# Patient Record
Sex: Female | Born: 1995 | Race: White | Hispanic: No | Smoking: Never smoker
Health system: Southern US, Community
[De-identification: ages and names within clinical notes are randomized; demographics above are authoritative.]

## PROBLEM LIST (undated history)

## (undated) ENCOUNTER — Emergency Department (HOSPITAL_BASED_OUTPATIENT_CLINIC_OR_DEPARTMENT_OTHER): Payer: Medicaid Other | Source: Home / Self Care

## (undated) DIAGNOSIS — Z87442 Personal history of urinary calculi: Secondary | ICD-10-CM

## (undated) DIAGNOSIS — F419 Anxiety disorder, unspecified: Secondary | ICD-10-CM

## (undated) DIAGNOSIS — I10 Essential (primary) hypertension: Secondary | ICD-10-CM

## (undated) DIAGNOSIS — R87629 Unspecified abnormal cytological findings in specimens from vagina: Secondary | ICD-10-CM

## (undated) DIAGNOSIS — F909 Attention-deficit hyperactivity disorder, unspecified type: Secondary | ICD-10-CM

## (undated) HISTORY — PX: CARPAL TUNNEL RELEASE: SHX101

## (undated) HISTORY — PX: TONSILLECTOMY: SUR1361

## (undated) HISTORY — PX: TUBAL LIGATION: SHX77

## (undated) HISTORY — PX: LEEP: SHX91

## (undated) HISTORY — DX: Unspecified abnormal cytological findings in specimens from vagina: R87.629

---

## 2016-01-21 ENCOUNTER — Encounter (HOSPITAL_BASED_OUTPATIENT_CLINIC_OR_DEPARTMENT_OTHER): Payer: Self-pay | Admitting: Emergency Medicine

## 2016-01-21 DIAGNOSIS — R109 Unspecified abdominal pain: Secondary | ICD-10-CM | POA: Insufficient documentation

## 2016-01-21 DIAGNOSIS — O26892 Other specified pregnancy related conditions, second trimester: Secondary | ICD-10-CM | POA: Diagnosis not present

## 2016-01-21 DIAGNOSIS — I10 Essential (primary) hypertension: Secondary | ICD-10-CM | POA: Insufficient documentation

## 2016-01-21 DIAGNOSIS — Z3A19 19 weeks gestation of pregnancy: Secondary | ICD-10-CM | POA: Insufficient documentation

## 2016-01-21 LAB — URINALYSIS, ROUTINE W REFLEX MICROSCOPIC
BILIRUBIN URINE: NEGATIVE
Glucose, UA: NEGATIVE mg/dL
Ketones, ur: NEGATIVE mg/dL
Nitrite: NEGATIVE
Protein, ur: NEGATIVE mg/dL
SPECIFIC GRAVITY, URINE: 1.015 (ref 1.005–1.030)
pH: 6 (ref 5.0–8.0)

## 2016-01-21 LAB — URINE MICROSCOPIC-ADD ON

## 2016-01-21 NOTE — ED Notes (Signed)
Patient states that the last 2 -3  Nights when she lays down to go to sleep has had pain to her left side. The patient states that she feels better during the day.

## 2016-01-22 ENCOUNTER — Emergency Department (HOSPITAL_BASED_OUTPATIENT_CLINIC_OR_DEPARTMENT_OTHER)
Admission: EM | Admit: 2016-01-22 | Discharge: 2016-01-22 | Disposition: A | Discharge status: Home / Self Care | Payer: Medicaid Other | Attending: Emergency Medicine | Admitting: Emergency Medicine

## 2016-01-22 ENCOUNTER — Ambulatory Visit (HOSPITAL_BASED_OUTPATIENT_CLINIC_OR_DEPARTMENT_OTHER)
Admit: 2016-01-22 | Discharge: 2016-01-22 | Disposition: A | Discharge status: Home / Self Care | Payer: Medicaid Other | Attending: Emergency Medicine | Admitting: Emergency Medicine

## 2016-01-22 DIAGNOSIS — R319 Hematuria, unspecified: Secondary | ICD-10-CM | POA: Insufficient documentation

## 2016-01-22 DIAGNOSIS — R109 Unspecified abdominal pain: Secondary | ICD-10-CM | POA: Insufficient documentation

## 2016-01-22 DIAGNOSIS — N1330 Unspecified hydronephrosis: Secondary | ICD-10-CM | POA: Diagnosis not present

## 2016-01-22 HISTORY — DX: Essential (primary) hypertension: I10

## 2016-01-22 MED ORDER — IBUPROFEN 800 MG PO TABS: 800.0000 mg | ORAL_TABLET | Freq: Three times a day (TID) | ORAL | Status: DC | PRN

## 2016-01-22 NOTE — ED Provider Notes (Signed)
CSN: [id number]     Arrival date & time 01/21/16  2300 History  By signing my name below, I, Linna DarnerRussell Turner, attest that this documentation has been prepared under the direction and in the presence of physician practitioner, Paula LibraJohn Dae Antonucci, MD. Electronically Signed: Linna Darnerussell Turner, Scribe. 01/22/2016. 12:55 AM.   Chief Complaint  Patient presents with  . Back Pain    The history is provided by the patient. No language interpreter was used.     HPI Comments: Cindy Martinez is a 20 y.o. female who presents to the Emergency Department complaining of intermittent, severe, left flank pain beginning three days ago. Pt reports that her pain typically occurs at night but also occurs during the day on occasion. She describes her pain as a burning sensation. She is not having pain at the present time. She states that she has difficulty finding a comfortable position when her pain presents and notes that the pain is not exacerbated by movement. She reports one episode of nausea in association with her lower back pain. Pt is [redacted] weeks pregnant. She denies hematuria and vomiting.  Past Medical History  Diagnosis Date  . Hypertension     with pregancy   History reviewed. No pertinent past surgical history. History reviewed. No pertinent family history. Social History  Substance Use Topics  . Smoking status: Never Smoker   . Smokeless tobacco: None  . Alcohol Use: No   OB History    Gravida Para Term Preterm AB TAB SAB Ectopic Multiple Living   1              Review of Systems  A complete 10 system review of systems was obtained and all systems are negative except as noted in the HPI and PMH.   Allergies  Review of patient's allergies indicates no known allergies.  Home Medications   Prior to Admission medications   Medication Sig Start Date End Date Taking? Authorizing Provider  ibuprofen (ADVIL,MOTRIN) 800 MG tablet Take 1 tablet (800 mg total) by mouth every 8 (eight) hours as needed (for  pain). 01/22/16   Opal Dinning, MD   BP 154/83 mmHg  Pulse 88  Temp(Src) 98 F (36.7 C) (Oral)  Resp 18  Ht 5\' 3"  (1.6 m)  Wt 170 lb (77.111 kg)  BMI 30.12 kg/m2  SpO2 100%   Physical Exam General: Well-developed, well-nourished female in no acute distress; appearance consistent with age of record HENT: normocephalic; atraumatic Eyes: pupils equal, round and reactive to light; extraocular muscles intact Neck: supple Heart: regular rate and rhythm Lungs: clear to auscultation bilaterally Abdomen: soft; gravid, consistent with dates; nontender; bowel sounds present GU: no CVA tenderness Extremities: No deformity; full range of motion; pulses normal Neurologic: Awake, alert and oriented; motor function intact in all extremities and symmetric; no facial droop Skin: Warm and dry Psychiatric: Normal mood and affect  ED Course  Procedures (including critical care time)  DIAGNOSTIC STUDIES: Oxygen Saturation is 100% on RA, normal by my interpretation.    COORDINATION OF CARE: 12:56 AM Discussed treatment plan with pt at bedside and pt agreed to plan.  MDM   Nursing notes and vitals signs, including pulse oximetry, reviewed.  Summary of this visit's results, reviewed by myself:  Labs:  Results for orders placed or performed during the hospital encounter of 01/22/16 (from the past 24 hour(s))  Urinalysis, Routine w reflex microscopic (not at [facility])     Status: Abnormal   Collection Time: 01/21/16 11:25 PM  Result  Value Ref Range   Color, Urine YELLOW YELLOW   APPearance CLEAR CLEAR   Specific Gravity, Urine 1.015 1.005 - 1.030   pH 6.0 5.0 - 8.0   Glucose, UA NEGATIVE NEGATIVE mg/dL   Hgb urine dipstick LARGE (A) NEGATIVE   Bilirubin Urine NEGATIVE NEGATIVE   Ketones, ur NEGATIVE NEGATIVE mg/dL   Protein, ur NEGATIVE NEGATIVE mg/dL   Nitrite NEGATIVE NEGATIVE   Leukocytes, UA TRACE (A) NEGATIVE  Urine microscopic-add on     Status: Abnormal   Collection Time: 01/21/16  11:25 PM  Result Value Ref Range   Squamous Epithelial / LPF 0-5 (A) NONE SEEN   WBC, UA 0-5 0 - 5 WBC/hpf   RBC / HPF 6-30 0 - 5 RBC/hpf   Bacteria, UA RARE (A) NONE SEEN   History and urinalysis consistent with ureterolithiasis. We will treat her with ibuprofen as she is in her second trimester and wish to avoid narcotics. Will have her return later today for a renal ultrasound.  Final diagnoses:  Acute left flank pain   I personally performed the services described in this documentation, which was scribed in my presence. The recorded information has been reviewed and is accurate.   Paula Libra, MD 01/22/16 [phone]

## 2016-09-03 HISTORY — PX: TUBAL LIGATION: SHX77

## 2016-10-27 IMAGING — US US RENAL
1 series · 14 of 25 positions shown · non-contrast
Comparison: None.

CLINICAL DATA: 20-year-old who is currently approximately 19 weeks
pregnant, presenting with 3 day history of left flank pain.
Microscopic hematuria on urinalysis.

EXAM:
RENAL / URINARY TRACT ULTRASOUND COMPLETE

[Series 1: us renal · 0.20mm/px · 14 of 58 slices shown]
[im 1/58]
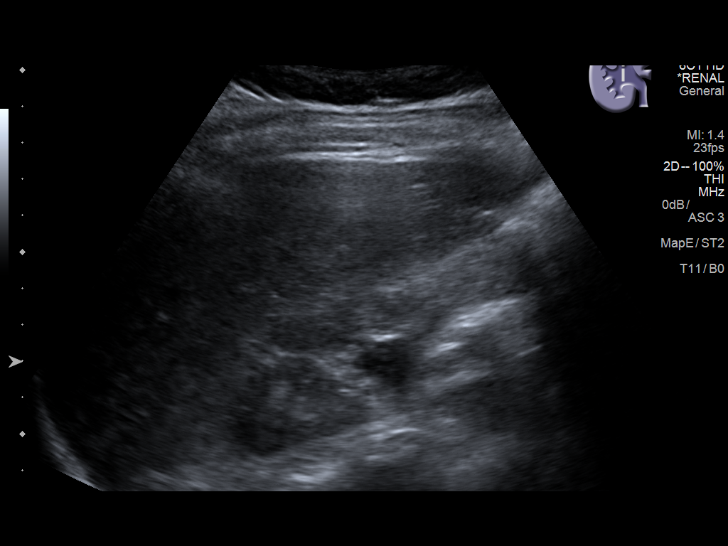
[im 5/58]
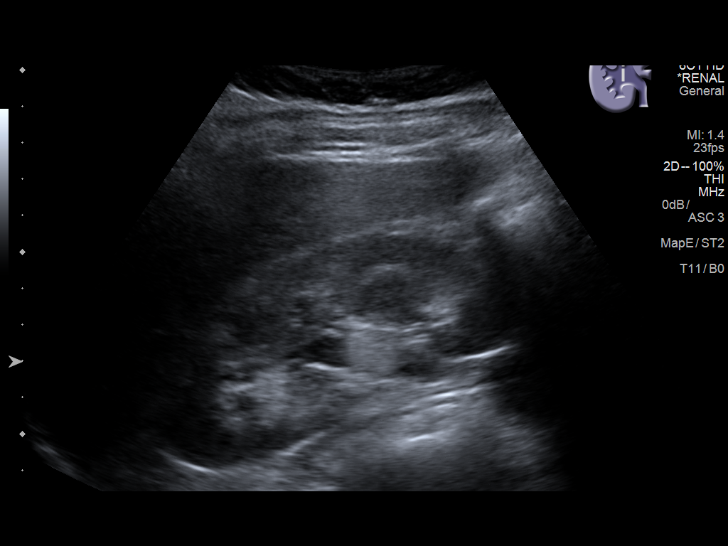
[im 10/58]
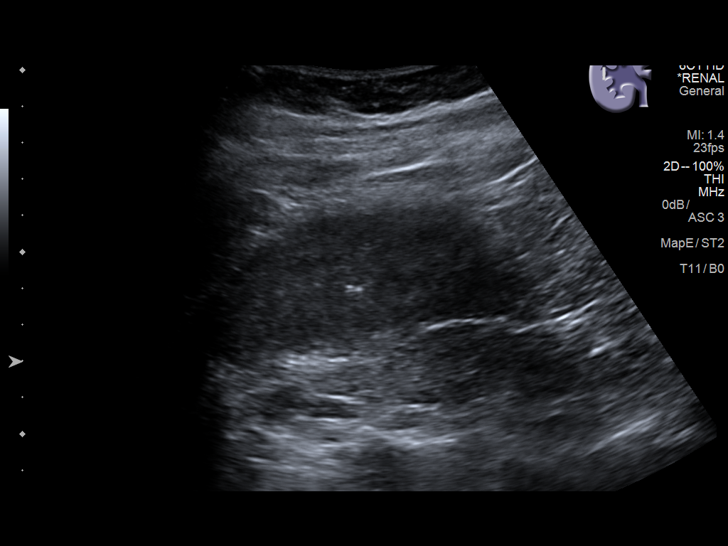
[im 15/58]
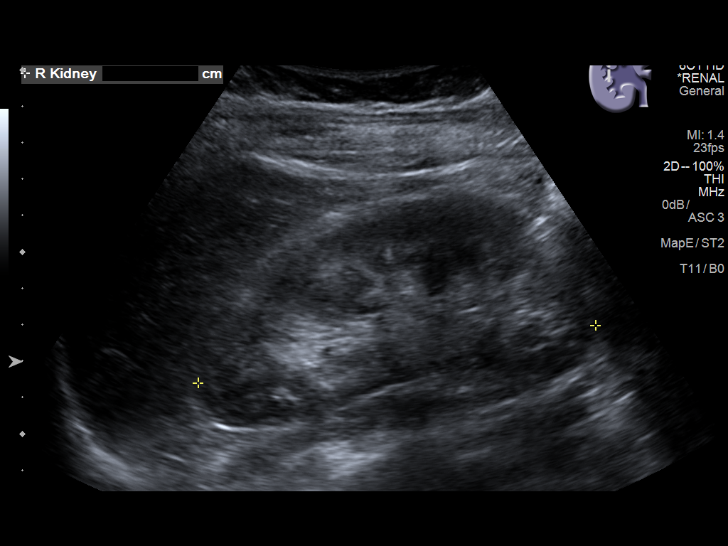
[im 20/58]
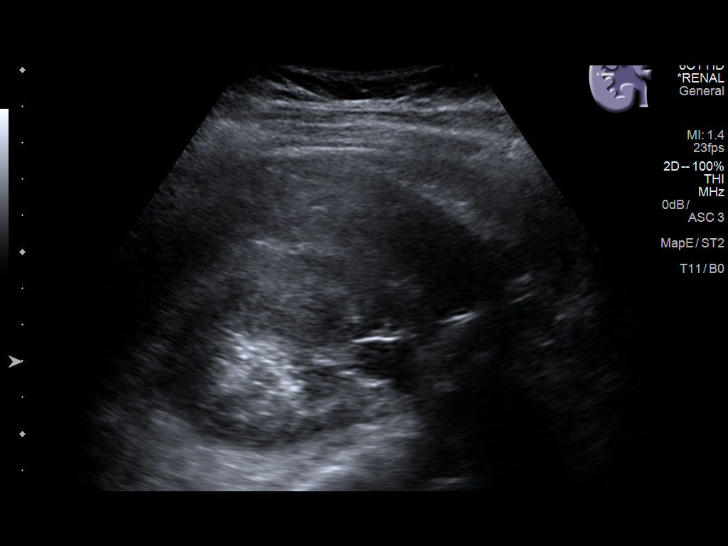
[im 22/58]
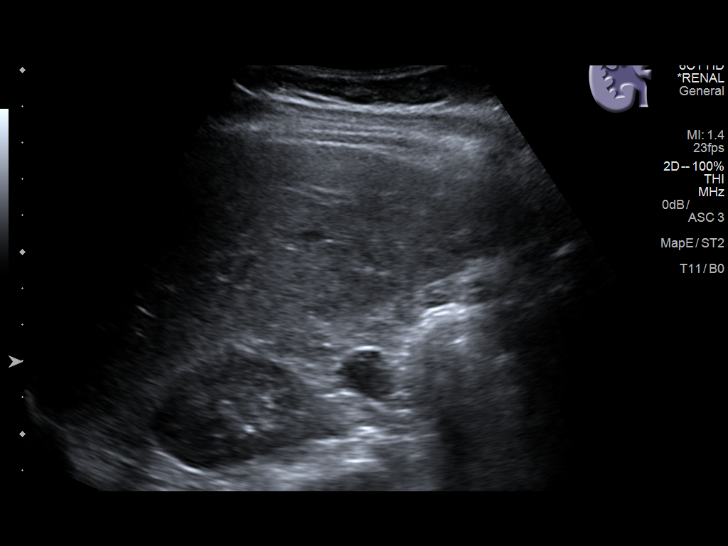
[im 27/58]
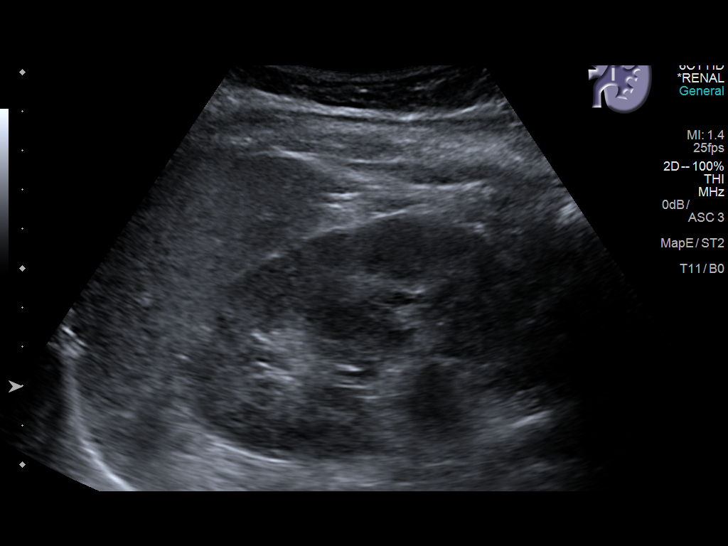
[im 31/58]
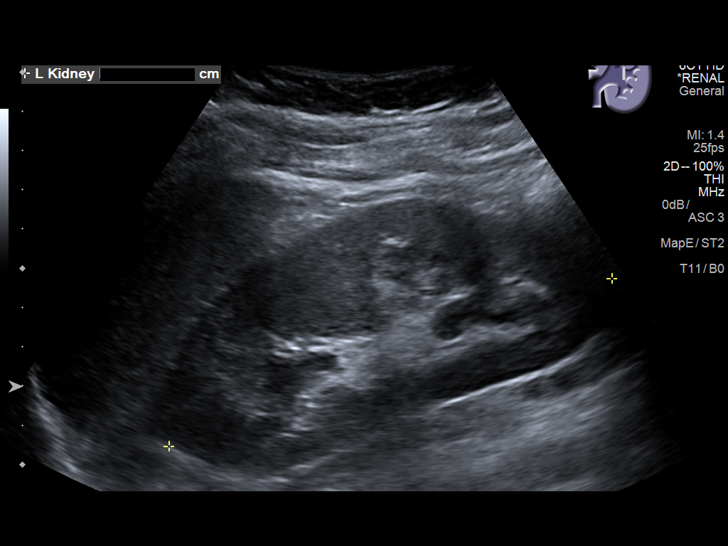
[im 36/58]
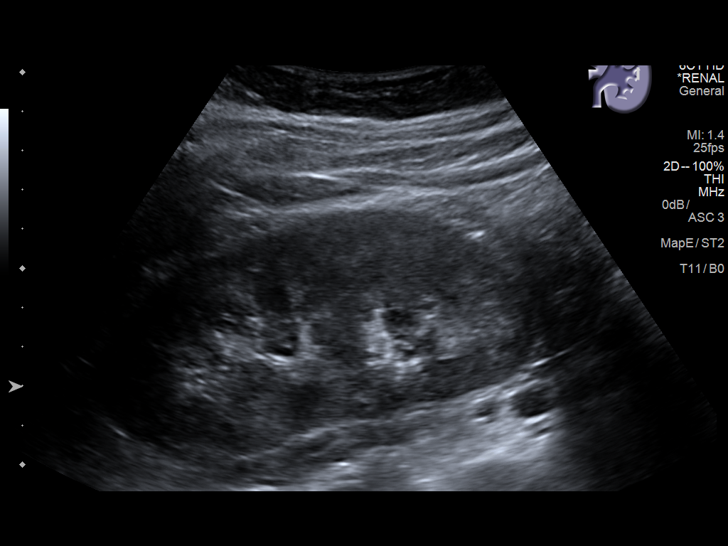
[im 39/58]
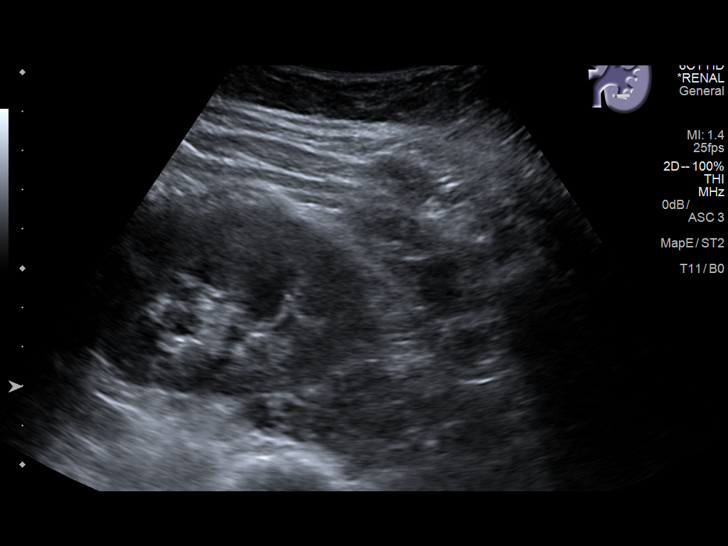
[im 43/58]
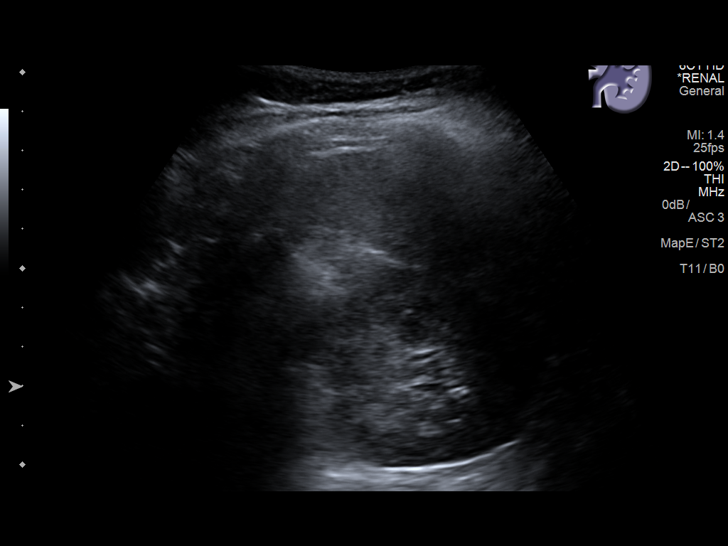
[im 48/58]
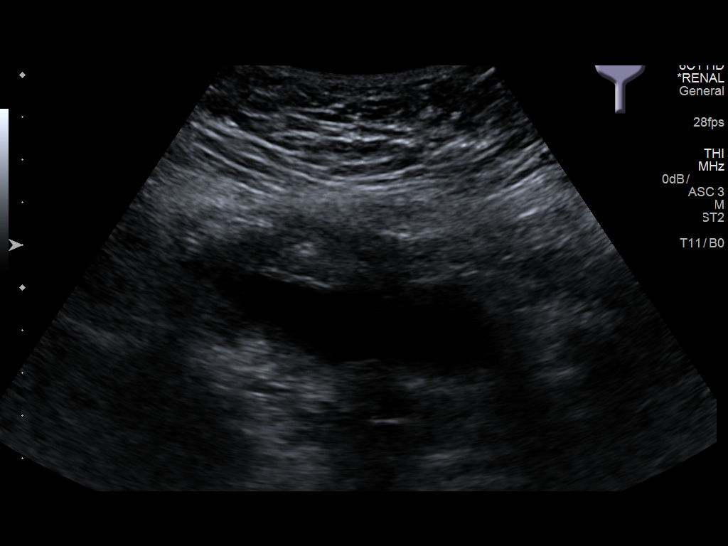
[im 53/58]
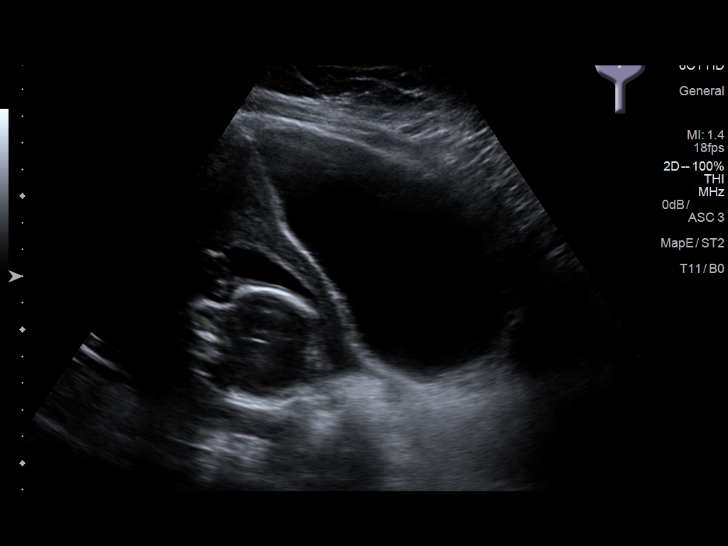
[im 58/58]
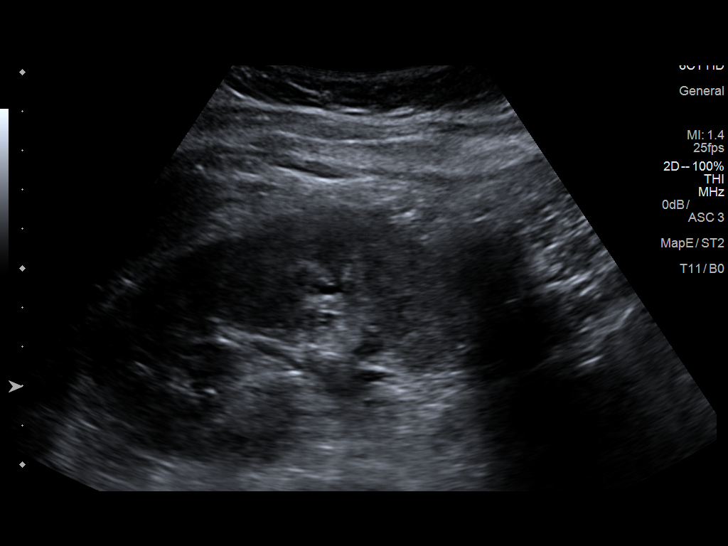

[14 of 25 positions shown; findings below may reference images not displayed]

FINDINGS: Right Kidney:

Length: Approximately 11.3 cm. No hydronephrosis. Well-preserved
cortex. No shadowing calculi. Normal parenchymal echotexture. No
focal parenchymal abnormality.

Left Kidney:

Length: Approximately 12.0 cm. Minimal to mild hydronephrosis.
Well-preserved cortex. No visible shadowing calculi. Normal
parenchymal echotexture. No focal parenchymal abnormality.

Bladder:

Normal in appearance. Patient is able to completely empty the
bladder upon voiding.

Other:

Cephalic presentation of the intrauterine fetus.
IMPRESSION: 1. Minimal to mild left hydronephrosis without visible obstructing
calculus. The hydronephrosis may be physiologic in this pregnant
patient.
2. Otherwise normal examination.

## 2018-09-03 HISTORY — PX: LEEP: SHX91

## 2018-09-03 HISTORY — PX: CARPAL TUNNEL RELEASE: SHX101

## 2019-07-25 ENCOUNTER — Other Ambulatory Visit: Payer: Self-pay

## 2019-07-25 ENCOUNTER — Emergency Department (HOSPITAL_BASED_OUTPATIENT_CLINIC_OR_DEPARTMENT_OTHER)
Admission: EM | Admit: 2019-07-25 | Discharge: 2019-07-25 | Disposition: A | Discharge status: Home / Self Care | Payer: Medicaid Other | Attending: Emergency Medicine | Admitting: Emergency Medicine

## 2019-07-25 ENCOUNTER — Emergency Department (HOSPITAL_BASED_OUTPATIENT_CLINIC_OR_DEPARTMENT_OTHER): Payer: Medicaid Other

## 2019-07-25 DIAGNOSIS — N200 Calculus of kidney: Secondary | ICD-10-CM | POA: Diagnosis not present

## 2019-07-25 DIAGNOSIS — N83201 Unspecified ovarian cyst, right side: Secondary | ICD-10-CM

## 2019-07-25 DIAGNOSIS — R1031 Right lower quadrant pain: Secondary | ICD-10-CM | POA: Diagnosis present

## 2019-07-25 DIAGNOSIS — R102 Pelvic and perineal pain: Secondary | ICD-10-CM

## 2019-07-25 LAB — COMPREHENSIVE METABOLIC PANEL
ALT: 24 U/L (ref 0–44)
AST: 23 U/L (ref 15–41)
Albumin: 4.4 g/dL (ref 3.5–5.0)
Alkaline Phosphatase: 61 U/L (ref 38–126)
Anion gap: 9 (ref 5–15)
BUN: 11 mg/dL (ref 6–20)
CO2: 21 mmol/L — ABNORMAL LOW (ref 22–32)
Calcium: 9.1 mg/dL (ref 8.9–10.3)
Chloride: 107 mmol/L (ref 98–111)
Creatinine, Ser: 0.7 mg/dL (ref 0.44–1.00)
GFR calc Af Amer: >60 mL/min (ref >60)
GFR calc non Af Amer: >60 mL/min (ref >60)
Glucose, Bld: 97 mg/dL (ref 70–99)
Potassium: 3.7 mmol/L (ref 3.5–5.1)
Sodium: 137 mmol/L (ref 135–145)
Total Bilirubin: 1.1 mg/dL (ref 0.3–1.2)
Total Protein: 7.5 g/dL (ref 6.5–8.1)

## 2019-07-25 LAB — WET PREP, GENITAL
Clue Cells Wet Prep HPF POC: NONE SEEN
Sperm: NONE SEEN
Trich, Wet Prep: NONE SEEN
Yeast Wet Prep HPF POC: NONE SEEN

## 2019-07-25 LAB — CBC WITH DIFFERENTIAL/PLATELET
Abs Immature Granulocytes: 0.03 10*3/uL (ref 0.00–0.07)
Basophils Absolute: 0.1 10*3/uL (ref 0.0–0.1)
Basophils Relative: 1 %
Eosinophils Absolute: 0.1 10*3/uL (ref 0.0–0.5)
Eosinophils Relative: 1 %
HCT: 44.4 % (ref 36.0–46.0)
Hemoglobin: 14.8 g/dL (ref 12.0–15.0)
Immature Granulocytes: 0 %
Lymphocytes Relative: 25 %
Lymphs Abs: 2.1 10*3/uL (ref 0.7–4.0)
MCH: 29 pg (ref 26.0–34.0)
MCHC: 33.3 g/dL (ref 30.0–36.0)
MCV: 87.1 fL (ref 80.0–100.0)
Monocytes Absolute: 0.5 10*3/uL (ref 0.1–1.0)
Monocytes Relative: 5 %
Neutro Abs: 5.9 10*3/uL (ref 1.7–7.7)
Neutrophils Relative %: 68 %
Platelets: 341 10*3/uL (ref 150–400)
RBC: 5.1 MIL/uL (ref 3.87–5.11)
RDW: 12.9 % (ref 11.5–15.5)
WBC: 8.7 10*3/uL (ref 4.0–10.5)
nRBC: 0 % (ref 0.0–0.2)

## 2019-07-25 LAB — URINALYSIS, ROUTINE W REFLEX MICROSCOPIC
Bilirubin Urine: NEGATIVE
Glucose, UA: NEGATIVE mg/dL
Hgb urine dipstick: NEGATIVE
Ketones, ur: NEGATIVE mg/dL
Nitrite: NEGATIVE
Protein, ur: NEGATIVE mg/dL
Specific Gravity, Urine: <1.005 — ABNORMAL LOW (ref 1.005–1.030)
pH: 6.5 (ref 5.0–8.0)

## 2019-07-25 LAB — PREGNANCY, URINE: Preg Test, Ur: NEGATIVE

## 2019-07-25 LAB — URINALYSIS, MICROSCOPIC (REFLEX): RBC / HPF: NONE SEEN RBC/hpf (ref 0–5)

## 2019-07-25 MED ORDER — SODIUM CHLORIDE 0.9 % IV BOLUS
1000.0000 mL | Freq: Once | INTRAVENOUS | Status: AC
  Administered 2019-07-25: 1000 mL via INTRAVENOUS

## 2019-07-25 MED ORDER — TRAMADOL HCL 50 MG PO TABS: 50.0000 mg | ORAL_TABLET | Freq: Four times a day (QID) | ORAL | 0 refills | Status: DC | PRN

## 2019-07-25 MED ORDER — MORPHINE SULFATE (PF) 4 MG/ML IV SOLN
4.0000 mg | Freq: Once | INTRAVENOUS | Status: AC
  Administered 2019-07-25: 4 mg via INTRAVENOUS
  Filled 2019-07-25: qty 1

## 2019-07-25 MED ORDER — KETOROLAC TROMETHAMINE 30 MG/ML IJ SOLN
30.0000 mg | Freq: Once | INTRAMUSCULAR | Status: AC
Start: 2019-07-25 — End: 2019-07-25
  Administered 2019-07-25: 30 mg via INTRAVENOUS
  Filled 2019-07-25: qty 1

## 2019-07-25 NOTE — ED Provider Notes (Signed)
[facility] EMERGENCY DEPARTMENT Provider Note   CSN: [id number] Arrival date & time: 07/25/19  1449     History   Chief Complaint Chief Complaint  Patient presents with   Flank Pain   Hematuria    HPI Cindy SharpsMariya Martinez is a 23 y.o. female.     HPI  Patient is 23 year old female presenting today for 4 weeks of right-sided pelvic pain that radiates into her back on the right side associated nausea, urinary urgency, and hematuria over the past 2 weeks.  Patient states that she has frequent kidney stones approximately 2 to 3/year that she has had since she was in her early teens.  Patient states she has not been seen urology or primary care doctor about this.  Patient states that she passed her last kidney stone 4 weeks ago which she states was visible in her urine.  Patient states that she has some bloating around the time but has since had intermittent, sharp, moderate to severe right lower quadrant and right flank pain.  Patient states there are no aggravating or mitigating factors.  States that she has been hydrating with lots of water.  States that her urine has been dark as if she was dehydrated.  States that at the end of urination she saw some amount of blood intermittently for the past 2 weeks.  States that she came to the ED today because of the pain got too bad.  Patient states she has taken no medications for pain today however she is used ibuprofen 800 mg 3 times a day in the past without any relief.  Patient states she is never had a CT scan of her abdomen.  She has had an US done 3 years ago and was found to have hydronephrosis.   Past Medical History:  Diagnosis Date   Hypertension    with pregancy    There are no active problems to display for this patient.   No past surgical history on file.   OB History    Gravida  1   Para      Term      Preterm      AB      Living        SAB      TAB      Ectopic      Multiple      Live Births              Home Medications    Prior to Admission medications   Medication Sig Start Date End Date Taking? Authorizing Provider  ibuprofen (ADVIL,MOTRIN) 800 MG tablet Take 1 tablet (800 mg total) by mouth every 8 (eight) hours as needed (for pain). 01/22/16   Molpus, John, MD    Family History No family history on file.  Social History Social History   Tobacco Use   Smoking status: Never Smoker  Substance Use Topics   Alcohol use: No   Drug use: No     Allergies   Patient has no known allergies.   Review of Systems Review of Systems  Constitutional: Negative for chills and fever.  HENT: Negative for congestion.   Respiratory: Negative for cough.   Cardiovascular: Negative for chest pain.  Gastrointestinal: Positive for abdominal pain and nausea. Negative for constipation, diarrhea, rectal pain and vomiting.  Genitourinary: Positive for flank pain, hematuria, pelvic pain and urgency. Negative for dysuria, vaginal bleeding, vaginal discharge and vaginal pain.  Musculoskeletal: Positive for back pain. Negative  for neck pain.  Skin: Negative for rash and wound.  All other systems reviewed and are negative.    Physical Exam Updated Vital Signs BP (!) 149/94 (BP Location: Right Arm)    Pulse 100    Temp 98.9 F (37.2 C) (Oral)    Resp 18    LMP 06/19/2019    SpO2 99%   Physical Exam Vitals signs and nursing note reviewed. Exam conducted with a chaperone present.  Constitutional:      General: She is not in acute distress.    Comments: Patient is well-appearing but uncomfortable  HENT:     Head: Normocephalic and atraumatic.     Nose: Nose normal.  Eyes:     General: No scleral icterus. Neck:     Musculoskeletal: Normal range of motion.  Cardiovascular:     Rate and Rhythm: Normal rate and regular rhythm.     Pulses: Normal pulses.     Heart sounds: Normal heart sounds.  Pulmonary:     Effort: Pulmonary effort is normal. No respiratory distress.      Breath sounds: No wheezing.  Abdominal:     Palpations: Abdomen is soft.     Tenderness: There is no abdominal tenderness. There is left CVA tenderness. There is no right CVA tenderness or guarding.     Comments: No abdominal tenderness, tenderness is more of pelvic location.  Negative Murphy sign, negative McBurney, negative Rovsing negative psoas sign.  Genitourinary:    General: Normal vulva.     Comments: Vulva without lesions or abnormality Vaginal canal without abnormal discharge or lesion Cervix appears normal, is closed Right-sided adnexal tenderness, no CMT or left sided adnexal tenderness Musculoskeletal:     Right lower leg: No edema.     Left lower leg: No edema.  Skin:    General: Skin is warm and dry.     Capillary Refill: Capillary refill takes less than 2 seconds.  Neurological:     Mental Status: She is alert. Mental status is at baseline.  Psychiatric:        Mood and Affect: Mood normal.        Behavior: Behavior normal.      ED Treatments / Results  Labs (all labs ordered are listed, but only abnormal results are displayed) Labs Reviewed  WET PREP, GENITAL - Abnormal; Notable for the following components:      Result Value   WBC, Wet Prep HPF POC MANY (*)    All other components within normal limits  URINALYSIS, ROUTINE W REFLEX MICROSCOPIC - Abnormal; Notable for the following components:   Color, Urine STRAW (*)    Specific Gravity, Urine <1.005 (*)    Leukocytes,Ua SMALL (*)    All other components within normal limits  COMPREHENSIVE METABOLIC PANEL - Abnormal; Notable for the following components:   CO2 21 (*)    All other components within normal limits  URINALYSIS, MICROSCOPIC (REFLEX) - Abnormal; Notable for the following components:   Bacteria, UA FEW (*)    All other components within normal limits  PREGNANCY, URINE  CBC WITH DIFFERENTIAL/PLATELET  GC/CHLAMYDIA PROBE AMP ([place]) NOT AT [facility]    EKG None  Radiology Ct Renal  Stone Study  Result Date: 07/25/2019 CLINICAL DATA:  Flank pain. Right-sided flank pain. EXAM: CT ABDOMEN AND PELVIS WITHOUT CONTRAST TECHNIQUE: Multidetector CT imaging of the abdomen and pelvis was performed following the standard protocol without IV contrast. COMPARISON:  None. FINDINGS: Lower chest: The lung bases are  clear. The heart size is normal. Hepatobiliary: The liver is normal. Normal gallbladder.There is no biliary ductal dilation. Pancreas: Normal contours without ductal dilatation. No peripancreatic fluid collection. Spleen: No splenic laceration or hematoma. Adrenals/Urinary Tract: --Adrenal glands: No adrenal hemorrhage. --Right kidney/ureter: There are multiple nonobstructing stones throughout the right kidney. There is mild right-sided collecting system dilatation without evidence for an obstructing stone. --Left kidney/ureter: There are nonobstructing stones in the lower pole the left kidney. --Urinary bladder: Unremarkable. Stomach/Bowel: --Stomach/Duodenum: No hiatal hernia or other gastric abnormality. Normal duodenal course and caliber. --Small bowel: No dilatation or inflammation. --Colon: No focal abnormality. --Appendix: Normal. Vascular/Lymphatic: Normal course and caliber of the major abdominal vessels. --No retroperitoneal lymphadenopathy. --No mesenteric lymphadenopathy. --No pelvic or inguinal lymphadenopathy. Reproductive: There is a right-sided ovarian cyst measuring approximately 7.6 by 5.4 cm and 21 Hounsfield units. The left ovary is unremarkable. Other: No ascites or free air. The abdominal wall is normal. Musculoskeletal. No acute displaced fractures. IMPRESSION: 1. Right-sided ovarian cyst measuring up to 7.6 cm. Recommend ultrasound follow-up in 6-8 weeks. If there is clinical concern for an acute right lower quadrant process, follow-up with a more urgent ultrasound is recommended. 2. Bilateral nonobstructive nephrolithiasis. 3. Mild right-sided collecting system  dilatation without evidence for an obstructing stone. Findings may represent sequela of a recently passed stone. Electronically Signed   By: Katherine Mantle M.D.   On: 07/25/2019 17:14    Procedures Procedures (including critical care time)  Medications Ordered in ED Medications  sodium chloride 0.9 % bolus 1,000 mL (0 mLs Intravenous Stopped 07/25/19 1740)  ketorolac (TORADOL) 30 MG/ML injection 30 mg (30 mg Intravenous Given 07/25/19 1643)  morphine 4 MG/ML injection 4 mg (4 mg Intravenous Given 07/25/19 1754)     Initial Impression / Assessment and Plan / ED Course  I have reviewed the triage vital signs and the nursing notes.  Pertinent labs & imaging results that were available during my care of the patient were reviewed by me and considered in my medical decision making (see chart for details).       Patient is 23 year old female with history of numerous kidney stones.  Presenting with right pelvic pain that radiates into her flank and low back.  Patient also states she has had some urgency and frequency.  Also endorses hematuria for the past 2 weeks.  States that she recently passed a kidney stone 4 weeks ago but has had persistent pain since.  States the pain is worse today.  No improvement ibuprofen.  Physical exam shows right-sided pelvic pain.  Patient also has left-sided CVA tenderness.  Suspect cyst, recurrent kidney stone, pyelonephritis, urinary tract infection, Fitz-Hugh Curtis.  Doubt appendicitis as patient does not have true abdominal tenderness, no anorexia, no migratory pain, no vomiting.  We will check CBC, CMP, GC chlamydia, wet prep, conduct pelvic exam, UA, urine pregnancy, lipase and order CT renal study.  CT renal scan shows bilateral nephrolithiasis nonobstructing, also there is right-sided ovarian cyst measuring 7.6 by 5.4 cm.  Pelvic exam conducted at this time significant for right-sided adnexal tenderness.  Will follow up with ultrasound to rule out  torsion.   Patient given 1 L of fluids, Toradol reassess.  No improvement in pain.  Patient states ibuprofen and NSAIDs do not work for her pain.  Patient does not demonstrate any evidence of drug-seeking behavior.  Will give 1 dose of morphine and reassess pain. Mild TTP right pelvic area stable - unchanging.   Lab work without  notable abnormality.  Ultrasound pending.    6:22 PM ultrasound pending.  If negative for torsion will discharge patient with instructions use ibuprofen for cyst pain and follow-up with primary care doctor or OB/GYN.  Patient also has received referral for urology and due to her frequent, chronic kidney stones. Patient care signed off to [facility].     Final Clinical Impressions(s) / ED Diagnoses   Final diagnoses:  Adnexal pain  Cyst of right ovary  Nephrolithiasis    ED Discharge Orders    None       Gailen Shelter, [place] 07/25/19 1831    Charlynne Pander, MD 07/26/19 [phone]

## 2019-07-25 NOTE — ED Triage Notes (Signed)
Pt here with hematuria and right flank pain.

## 2019-07-25 NOTE — Discharge Instructions (Addendum)
Please also urine with the filter that I gave you.  Save any kidney stones that he passed and follow-up with urology to help and was.  Please follow-up with your OB/GYN or primary care doctor for discussion and continued treatment of your right ovarian cyst.  The cyst procedure at a higher risk for torsion which would cause severe right lower quadrant pain.  Usually accompanied by nausea and decreased appetite.  Please return to the ED if you have any symptoms concerning for this or any new or concerning symptoms.  Please use Tylenol ibuprofen.  Please use Tylenol or ibuprofen for pain.  You may use 600 mg ibuprofen every 6 hours or 1000 mg of Tylenol every 6 hours.  You may choose to alternate between the 2.  This would be most effective.  Not to exceed 4 g of Tylenol within 24 hours.  Not to exceed 3200 mg ibuprofen 24 hours.

## 2019-07-25 NOTE — ED Notes (Signed)
Patient transported to Ultrasound 

## 2019-08-05 ENCOUNTER — Other Ambulatory Visit: Payer: Self-pay

## 2019-08-05 ENCOUNTER — Ambulatory Visit (INDEPENDENT_AMBULATORY_CARE_PROVIDER_SITE_OTHER): Payer: Medicaid Other | Admitting: Obstetrics & Gynecology

## 2019-08-05 ENCOUNTER — Encounter: Payer: Self-pay | Admitting: Obstetrics & Gynecology

## 2019-08-05 ENCOUNTER — Other Ambulatory Visit (HOSPITAL_COMMUNITY)
Admission: RE | Admit: 2019-08-05 | Discharge: 2019-08-05 | Disposition: A | Discharge status: Home / Self Care | Payer: Medicaid Other | Source: Ambulatory Visit | Attending: Obstetrics & Gynecology | Admitting: Obstetrics & Gynecology

## 2019-08-05 VITALS — BP 131/80 | HR 87 | Ht 63.0 in | Wt 183.1 lb

## 2019-08-05 DIAGNOSIS — Z0000 Encounter for general adult medical examination without abnormal findings: Secondary | ICD-10-CM | POA: Diagnosis not present

## 2019-08-05 DIAGNOSIS — Z01419 Encounter for gynecological examination (general) (routine) without abnormal findings: Secondary | ICD-10-CM | POA: Diagnosis present

## 2019-08-05 DIAGNOSIS — N83201 Unspecified ovarian cyst, right side: Secondary | ICD-10-CM

## 2019-08-05 DIAGNOSIS — N946 Dysmenorrhea, unspecified: Secondary | ICD-10-CM

## 2019-08-05 MED ORDER — DICLOFENAC POTASSIUM 50 MG PO TABS: 50.0000 mg | ORAL_TABLET | Freq: Three times a day (TID) | ORAL | 3 refills | Status: DC

## 2019-08-05 NOTE — Progress Notes (Signed)
Subjective:     Cindy Martinez is a 23 y.o. female here for a routine exam.G2P2. Pt reports that she was seen in the ED for right sided pain. Pt reports a h/o kidney stones but, this pain was differed than the prev pain that she has had. She reports that she noticed a bulge on her right side. Current complaints: pt reports cycles every 1-2 months.      Gynecologic History Patient's last menstrual period was 07/26/2019. Contraception: tubal ligation Last Pap: 2 years prev  Results were: normal Last mammogram: n/a  Obstetric History OB History  Gravida Para Term Preterm AB Living  2 2 2     1   SAB TAB Ectopic Multiple Live Births          1    # Outcome Date GA Lbr Len/2nd Weight Sex Delivery Anes PTL Lv  2 Term 2017 108w0d   F Vag-Spont None N LIV  1 Term 2016 [redacted]w[redacted]d   M Vag-Spont None N     The following portions of the patient's history were reviewed and updated as appropriate: allergies, current medications, past family history, past medical history, past social history, past surgical history and problem list.  Review of Systems Pertinent items are noted in HPI.    Objective:  BP 131/80   Pulse 87   Ht 5\' 3"  (1.6 m)   Wt 183 lb 1.9 oz (83.1 kg)   LMP 07/26/2019   Breastfeeding No   BMI 32.44 kg/m    07/25/2019 CLINICAL DATA:  Pain with large right ovarian cyst.  EXAM: TRANSABDOMINAL AND TRANSVAGINAL ULTRASOUND OF PELVIS  DOPPLER ULTRASOUND OF OVARIES  TECHNIQUE: Both transabdominal and transvaginal ultrasound examinations of the pelvis were performed. Transabdominal technique was performed for global imaging of the pelvis including uterus, ovaries, adnexal regions, and pelvic cul-de-sac.  It was necessary to proceed with endovaginal exam following the transabdominal exam to visualize the ovaries. Color and duplex Doppler ultrasound was utilized to evaluate blood flow to the ovaries.  COMPARISON:  CT from same day  FINDINGS: Uterus  Measurements: 9.4  x 5.1 x 6.4 cm = volume: 161 mL. No fibroids or other mass visualized.  Endometrium  Thickness: 7 mm period. A small amount of free fluid in the endometrial canal.  Right ovary  Measurements: 7.4 x 6.2 x 6.3 cm. = volume: 149 mL. There is a 6.3 x 5.8 x 6 cm cystic structure involving the right ovary. There is some low level internal echoes.  Left ovary  Measurements: 3.3 x 1.7 x 2 cm = volume: 6 mL. Normal appearance/no adnexal mass.  Pulsed Doppler evaluation of both ovaries demonstrates normal low-resistance arterial and venous waveforms.  Other findings  No abnormal free fluid.  IMPRESSION: 1. No acute sonographic abnormality. 2. Mostly simple appearing 6.3 cm cyst arising from the right ovary. A follow-up ultrasound is recommended in 6-8 weeks to confirm resolution or stability of this finding.  Assessment:    Healthy female exam.   Right ovarian cyst- this is simple in nature. Currently asymptomatic  Dysmenorrhea- primary     Plan:    Follow up in: 3 months.    F/u TV US with appt following F/u PAP and cx Cataflam 50mg  po tid prn pain Pt told to hold all other NSAIDS  [name], M.D., [name]

## 2019-08-10 ENCOUNTER — Other Ambulatory Visit: Payer: Self-pay | Admitting: Obstetrics & Gynecology

## 2019-08-10 DIAGNOSIS — R102 Pelvic and perineal pain: Secondary | ICD-10-CM

## 2019-08-10 LAB — CYTOLOGY - PAP
Chlamydia: NEGATIVE
Comment: NEGATIVE
Comment: NORMAL
Diagnosis: HIGH — AB
Neisseria Gonorrhea: POSITIVE — AB

## 2019-08-10 MED ORDER — NAPROXEN 500 MG PO TABS: 500.0000 mg | ORAL_TABLET | Freq: Two times a day (BID) | ORAL | 2 refills | Status: DC | PRN

## 2019-08-11 ENCOUNTER — Ambulatory Visit (INDEPENDENT_AMBULATORY_CARE_PROVIDER_SITE_OTHER): Payer: Medicaid Other

## 2019-08-11 ENCOUNTER — Other Ambulatory Visit: Payer: Self-pay

## 2019-08-11 ENCOUNTER — Telehealth: Payer: Self-pay

## 2019-08-11 VITALS — BP 139/87 | HR 94 | Ht 63.0 in | Wt 182.0 lb

## 2019-08-11 DIAGNOSIS — A549 Gonococcal infection, unspecified: Secondary | ICD-10-CM

## 2019-08-11 MED ORDER — AZITHROMYCIN 500 MG PO TABS: 1000.0000 mg | ORAL_TABLET | Freq: Every day | ORAL | 0 refills | Status: AC

## 2019-08-11 MED ORDER — CEFTRIAXONE SODIUM 250 MG IJ SOLR
250.0000 mg | Freq: Once | INTRAMUSCULAR | Status: AC
  Administered 2019-08-11: 250 mg via INTRAMUSCULAR

## 2019-08-11 NOTE — Telephone Encounter (Signed)
Patient called back- verified by name and dob. Patient made aware that she has positive result for gonerrhea on her pap smear.Patient coming today for rocephin injection.  Patient also made aware of abnormal pap smear and need for colposcopy. Patient schedule for that appointment. Kathrene Alu RN

## 2019-08-11 NOTE — Progress Notes (Signed)
Reviewed chart, lab results and plan of care Agree with medication administration as per protocol  Seabron Spates, CNM

## 2019-08-11 NOTE — Telephone Encounter (Signed)
-----   Message from Lavonia Drafts, MD sent at 08/10/2019  4:13 PM EST ----- Please call pt. She needs a colpo.  Thx,  clh-S

## 2019-08-11 NOTE — Telephone Encounter (Signed)
Patient also need treatment for gonerrhea- will need nurse visit for rocephin.  Left message for patient to call back for results. Kathrene Alu RN

## 2019-08-11 NOTE — Progress Notes (Signed)
Cindy Martinez here for Rocephin  Injection.  Injection administered without complication. Patient will return in As needed  for next injection. Pt advised to abstain from sex for 10 days after she and partner are treated.   chiquita l wilson, CMA 08/11/2019  4:10 PM

## 2019-08-12 LAB — GC/CHLAMYDIA PROBE AMP (~~LOC~~) NOT AT ARMC
Chlamydia: NEGATIVE
Neisseria Gonorrhea: POSITIVE — AB

## 2019-08-26 ENCOUNTER — Telehealth: Payer: Self-pay

## 2019-08-26 NOTE — Telephone Encounter (Signed)
Pt sent Mychart message stating she is having pain and Naproxen does not help her pain. Pt made aware that per Dr. Purvis Kilts, she can start OCPs or IUD for the pain. Pt states she does not want to take birth control because she has had a bad reaction to birth control in the past. Will send message to provider.  Dieter Hane l Beyounce Dickens, CMA

## 2019-08-29 ENCOUNTER — Other Ambulatory Visit: Payer: Self-pay

## 2019-08-29 ENCOUNTER — Emergency Department (HOSPITAL_BASED_OUTPATIENT_CLINIC_OR_DEPARTMENT_OTHER)
Admission: EM | Admit: 2019-08-29 | Discharge: 2019-08-29 | Disposition: A | Discharge status: Home / Self Care | Payer: Medicaid Other | Attending: Emergency Medicine | Admitting: Emergency Medicine

## 2019-08-29 ENCOUNTER — Encounter (HOSPITAL_BASED_OUTPATIENT_CLINIC_OR_DEPARTMENT_OTHER): Payer: Self-pay | Admitting: Emergency Medicine

## 2019-08-29 DIAGNOSIS — G5601 Carpal tunnel syndrome, right upper limb: Secondary | ICD-10-CM | POA: Insufficient documentation

## 2019-08-29 DIAGNOSIS — M25531 Pain in right wrist: Secondary | ICD-10-CM | POA: Diagnosis present

## 2019-08-29 NOTE — Discharge Instructions (Addendum)
Please take Tylenol and ibuprofen as discussed.  Please use the splint at night.  Please follow-up with Harlin Heys, MD who is a hand surgeon and orthopedist to evaluate you and discuss further treatment options.  I also included information for [place] which is a primary care service.  Please establish care with them.

## 2019-08-29 NOTE — ED Provider Notes (Signed)
[facility] EMERGENCY DEPARTMENT Provider Note   CSN: [id number] Arrival date & time: 08/29/19  0350     History Chief Complaint  Patient presents with  . Numbness    Cindy Martinez is a 23 y.o. female.  HPI  Patient is a 23 year old female with no pertinent past medical history presented today for right wrist pain that began at age 63 has been intermittent since.  Patient states that pain has been constant for the past 1 month worse over the past 1 week.  Patient describes pain as burning, tingling with decreased sensation.  Patient works as a Armed forces technical officer and states that the pain is worse at work.  Also states pain is worse at night and sometimes prevents her from sleeping.  Patient has been diagnosed with carpal tunnel in the past and given a wrist splint and prescribed NSAIDs however she has not been using these recently as she states he does not work.  Patient states that her symptoms were worse during past pregnancies.  Denies any fevers, chills, dysarthria, dysphagia, other limb weakness, history of strokes, nausea or vomiting.  Patient states that her symptoms today are consistent with prior episodes of her hand pain and numbness.     History reviewed. No pertinent past medical history.  There are no problems to display for this patient.   History reviewed. No pertinent surgical history.   OB History    Gravida  2   Para  2   Term  2   Preterm      AB      Living  1     SAB      TAB      Ectopic      Multiple      Live Births  1           No family history on file.  Social History   Tobacco Use  . Smoking status: Never Smoker  . Smokeless tobacco: Never Used  Substance Use Topics  . Alcohol use: Yes  . Drug use: No    Home Medications Prior to Admission medications   Medication Sig Start Date End Date Taking? Authorizing Provider  ibuprofen (ADVIL,MOTRIN) 800 MG tablet Take 1 tablet (800 mg total) by mouth every 8 (eight)  hours as needed (for pain). Patient not taking: Reported on 08/11/2019 01/22/16   Molpus, Jenny Reichmann, MD  naproxen (NAPROSYN) 500 MG tablet Take 1 tablet (500 mg total) by mouth 2 (two) times daily as needed for moderate pain. As needed for pain. Please take with meals. Patient not taking: Reported on 08/11/2019 08/10/19   Lavonia Drafts, MD  traMADol (ULTRAM) 50 MG tablet Take 1 tablet (50 mg total) by mouth every 6 (six) hours as needed. 07/25/19   Recardo Evangelist, PA-C    Allergies    Patient has no known allergies.  Review of Systems   Review of Systems  Constitutional: Negative for fever.  HENT: Negative for congestion.   Respiratory: Negative for shortness of breath.   Cardiovascular: Negative for chest pain.  Gastrointestinal: Negative for abdominal distention.  Musculoskeletal:       Right hand pain  Neurological: Positive for numbness. Negative for dizziness, seizures, speech difficulty, light-headedness and headaches.    Physical Exam Updated Vital Signs BP (!) 155/97 (BP Location: Left Arm)   Pulse 99   Temp 99.1 F (37.3 C) (Oral)   Resp 18   Ht 5\' 3"  (1.6 m)   Wt 81.6 kg  LMP 07/26/2019   SpO2 100%   BMI 31.89 kg/m   Physical Exam Vitals and nursing note reviewed.  Constitutional:      General: She is not in acute distress.    Appearance: Normal appearance. She is not ill-appearing.  HENT:     Head: Normocephalic and atraumatic.  Eyes:     General: No scleral icterus.       Right eye: No discharge.        Left eye: No discharge.     Conjunctiva/sclera: Conjunctivae normal.  Pulmonary:     Effort: Pulmonary effort is normal.     Breath sounds: No stridor.  Musculoskeletal:     Comments: Full range of motion of right wrist strength 5/5 flexion extension.  No tenderness to palpation of shoulder, elbow, wrist, fingers.  Good cap refill less than 2 seconds and all right fingers.  Grip strength equal bilaterally 5/5.    Neurological:     Mental  Status: She is alert and oriented to person, place, and time. Mental status is at baseline.     Comments: Sensation intact but decreased in right and significantly in right middle finger there is decreased sensation light touch.  Positive Phalen sign.  Negative Tinel's.     ED Results / Procedures / Treatments   Labs (all labs ordered are listed, but only abnormal results are displayed) Labs Reviewed - No data to display  EKG None  Radiology No results found.  Procedures Procedures (including critical care time)  Medications Ordered in ED Medications - No data to display  ED Course  I have reviewed the triage vital signs and the nursing notes.  Pertinent labs & imaging results that were available during my care of the patient were reviewed by me and considered in my medical decision making (see chart for details).    MDM Rules/Calculators/A&P                      Patient is 23 year old female with no pertinent past medical history other than prior diagnosis of carpal tunnel presented today with 1 month of worsening symptoms of intermittent for 7 years.  Patient has not been using her night splint or ibuprofen as she states that these had not been effective in the past.  Doubt septic joint as patient has no warmth or tenderness of the joint.  Has full range of motion without pain.  Doubt stroke as patient has isolated wrist pain and intact strength has no other symptoms or major risk factors for stroke.  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  Will refer to Dr. Roney Mans who is on-call with [facility] as patient has had this issue for 7 years and may be candidate for surgery pending his evaluation.  Will recommend she use his splint which I will provide for her in ED today and recommend ibuprofen and Tylenol in the interim.  Patient understands follow-up instructions and  will call Monday for an appointment.  Patient with elevated blood pressure.  We discussed this.  She sees OB and does not have a primary care doctor.  Will recommend [facility] for follow up.    This patient appears reasonably screened and I doubt any other medical condition requiring further workup, evaluation, or treatment in the ED at this time prior to discharge.   Patient's vitals are WNL apart from vital sign  abnormalities discussed above, patient is in NAD, and able to ambulate in the ED at their baseline. Pain has been managed or a plan has been made for home management and has no complaints prior to discharge. Patient is comfortable with above plan and is stable for discharge at this time. All questions were answered prior to disposition. Results from the ER workup discussed with the patient face to face and all questions answered to the best of my ability. The patient is safe for discharge with strict return precautions. Patient appears safe for discharge with appropriate follow-up. Conveyed my impression with the patient and they voiced understanding and are agreeable to plan.   An After Visit Summary was printed and given to the patient.  Portions of this note were generated with Scientist, clinical (histocompatibility and immunogenetics)Dragon dictation software. Dictation errors may occur despite best attempts at proofreading.    Final Clinical Impression(s) / ED Diagnoses Final diagnoses:  Carpal tunnel syndrome of right wrist    Rx / DC Orders ED Discharge Orders    None       Gailen ShelterFondaw, Zohan Shiflet S, [place] 08/29/19 [id number]    Terrilee FilesButler, Michael C, MD 08/29/19 1731

## 2019-08-29 NOTE — ED Triage Notes (Signed)
R hand numbness and pain x 1 week. States it has been intermittent over the past year but worsening and more persistent this week.

## 2019-08-31 ENCOUNTER — Ambulatory Visit (HOSPITAL_BASED_OUTPATIENT_CLINIC_OR_DEPARTMENT_OTHER)
Admission: RE | Admit: 2019-08-31 | Discharge: 2019-08-31 | Disposition: A | Discharge status: Home / Self Care | Payer: Medicaid Other | Source: Ambulatory Visit | Attending: Obstetrics & Gynecology | Admitting: Obstetrics & Gynecology

## 2019-08-31 ENCOUNTER — Other Ambulatory Visit: Payer: Self-pay

## 2019-08-31 DIAGNOSIS — N83201 Unspecified ovarian cyst, right side: Secondary | ICD-10-CM

## 2019-08-31 DIAGNOSIS — N946 Dysmenorrhea, unspecified: Secondary | ICD-10-CM | POA: Insufficient documentation

## 2019-09-09 ENCOUNTER — Ambulatory Visit (INDEPENDENT_AMBULATORY_CARE_PROVIDER_SITE_OTHER): Payer: Medicaid Other | Admitting: Obstetrics & Gynecology

## 2019-09-09 ENCOUNTER — Other Ambulatory Visit (HOSPITAL_COMMUNITY)
Admission: RE | Admit: 2019-09-09 | Discharge: 2019-09-09 | Disposition: A | Discharge status: Home / Self Care | Payer: Medicaid Other | Source: Ambulatory Visit | Attending: Obstetrics & Gynecology | Admitting: Obstetrics & Gynecology

## 2019-09-09 ENCOUNTER — Other Ambulatory Visit: Payer: Self-pay

## 2019-09-09 ENCOUNTER — Encounter: Payer: Self-pay | Admitting: Obstetrics & Gynecology

## 2019-09-09 VITALS — BP 126/79 | HR 82 | Ht 63.0 in | Wt 183.0 lb

## 2019-09-09 DIAGNOSIS — Z113 Encounter for screening for infections with a predominantly sexual mode of transmission: Secondary | ICD-10-CM | POA: Insufficient documentation

## 2019-09-09 DIAGNOSIS — R87613 High grade squamous intraepithelial lesion on cytologic smear of cervix (HGSIL): Secondary | ICD-10-CM | POA: Insufficient documentation

## 2019-09-09 DIAGNOSIS — Z3202 Encounter for pregnancy test, result negative: Secondary | ICD-10-CM | POA: Diagnosis not present

## 2019-09-09 DIAGNOSIS — D069 Carcinoma in situ of cervix, unspecified: Secondary | ICD-10-CM

## 2019-09-09 LAB — POCT URINE PREGNANCY: Preg Test, Ur: NEGATIVE

## 2019-09-09 NOTE — Patient Instructions (Signed)
Colposcopy, Care After This sheet gives you information about how to care for yourself after your procedure. Your health care provider may also give you more specific instructions. If you have problems or questions, contact your health care provider. What can I expect after the procedure? If you had a colposcopy without a biopsy, you can expect to feel fine right away, but you may have some spotting for a few days. You can go back to your normal activities. If you had a colposcopy with a biopsy, it is common to have:  Soreness and pain. This may last for a few days.  Light-headedness.  Mild vaginal bleeding or dark-colored, grainy discharge. This may last for a few days. The discharge may be due to a solution that was used during the procedure. You may need to wear a sanitary pad during this time.  Spotting for at least 48 hours after the procedure. Follow these instructions at home:   Take over-the-counter and prescription medicines only as told by your health care provider. Talk with your health care provider about what type of over-the-counter pain medicine and prescription medicine you can start taking again. It is especially important to talk with your health care provider if you take blood-thinning medicine.  Do not drive or use heavy machinery while taking prescription pain medicine.  For at least 3 days after your procedure, or as long as told by your health care provider, avoid: ? Douching. ? Using tampons. ? Having sexual intercourse.  Continue to use birth control (contraception).  Limit your physical activity for the first day after the procedure as told by your health care provider. Ask your health care provider what activities are safe for you.  It is up to you to get the results of your procedure. Ask your health care provider, or the department performing the procedure, when your results will be ready.  Keep all follow-up visits as told by your health care provider.  This is important. Contact a health care provider if:  You develop a skin rash. Get help right away if:  You are bleeding heavily from your vagina or you are passing blood clots. This includes using more than one sanitary pad per hour for 2 hours in a row.  You have a fever or chills.  You have pelvic pain.  You have abnormal, yellow-colored, or bad-smelling vaginal discharge. This could be a sign of infection.  You have severe pain or cramps in your lower abdomen that do not get better with medicine.  You feel light-headed or dizzy, or you faint. Summary  If you had a colposcopy without a biopsy, you can expect to feel fine right away, but you may have some spotting for a few days. You can go back to your normal activities.  If you had a colposcopy with a biopsy, you may notice mild pain and spotting for 48 hours after the procedure.  Avoid douching, using tampons, and having sexual intercourse for 3 days after the procedure or as long as told by your health care provider.  Contact your health care provider if you have bleeding, severe pain, or signs of infection. This information is not intended to replace advice given to you by your health care provider. Make sure you discuss any questions you have with your health care provider. Document Revised: 08/02/2017 Document Reviewed: 04/06/2016 Elsevier Patient Education  2020 [employer] LEEP POST-PROCEDURE INSTRUCTIONS  1. You may take Ibuprofen, Aleve or Tylenol for pain if needed.  Cramping is  normal.  2. You will have black and/or bloody discharge at first.  This will lighten and then turn clear before completely resolving.  This will take 2 to 3 weeks.  3. Put nothing in your vagina until the bleeding or discharge stops (usually 2 or3 days).  4. You need to call if you have redness around the biopsy site, if there is any unusual draining, if the bleeding is heavy, or if you are concerned.  5. Shower or bathe as normal   6. We will call you within one week with results or we will discuss the results at your follow-up appointment if needed.  7. You will need to return for a follow-up Pap smear as directed by your physician.

## 2019-09-09 NOTE — Addendum Note (Signed)
Addended by: Mikey Bussing on: 09/09/2019 02:43 PM   Modules accepted: Orders

## 2019-09-09 NOTE — Progress Notes (Signed)
Patient given informed consent, signed copy in the chart, time out was performed.  Placed in lithotomy position. Cervix viewed with speculum and colposcope after application of acetic acid.  08/05/2019 PositiveAbnormal     Chlamydia Negative   Adequacy Satisfactory for evaluation; transformation zone component PRESENT.   Diagnosis - High grade squamous intraepithelial lesion (HSIL)Abnormal    Comment Normal Reference Ranger Chlamydia - Negative   Comment Normal Reference Range Neisseria Gonorrhea - Negative    Colposcopy adequate?  yes Acetowhite lesions? Yes with lesion extending into the endocervix Punctation? Possibly at 1 o'clock Mosaicism?  no Abnormal vasculature?  No  Biopsies? yes ECC? yes   Patient was given post procedure instructions.  She will return in 4 week for possible LEEP. The biopsies will be aailabl on Epic or called to pt.   Yaniah Thiemann L. Harraway-Smith, M.D., [facility]

## 2019-09-11 LAB — CERVICOVAGINAL ANCILLARY ONLY
Chlamydia: NEGATIVE
Comment: NEGATIVE
Comment: NORMAL
Neisseria Gonorrhea: NEGATIVE

## 2019-09-11 LAB — SURGICAL PATHOLOGY

## 2019-09-14 ENCOUNTER — Telehealth: Payer: Self-pay

## 2019-09-14 NOTE — Telephone Encounter (Signed)
Patient called about her biopsy results. Patient had colposcopy and it did show some high grade dysplasia. Patient is already scheduled for a LEEP procedure and I explained this in detail to her. Patient also ask how long it should take for her to recover from the colposcopy biopsies made her aware that Dr. Erin Fulling recommends nothing in the vagina for two weeks after procedures in order to let the sites heal.  Patient states understanding. Cindy Stammer RN

## 2019-09-23 DIAGNOSIS — M25531 Pain in right wrist: Secondary | ICD-10-CM | POA: Insufficient documentation

## 2019-10-07 ENCOUNTER — Other Ambulatory Visit: Payer: Self-pay

## 2019-10-07 ENCOUNTER — Other Ambulatory Visit (HOSPITAL_COMMUNITY)
Admission: RE | Admit: 2019-10-07 | Discharge: 2019-10-07 | Disposition: A | Discharge status: Home / Self Care | Payer: Medicaid Other | Source: Ambulatory Visit | Attending: Obstetrics & Gynecology | Admitting: Obstetrics & Gynecology

## 2019-10-07 ENCOUNTER — Encounter: Payer: Self-pay | Admitting: Obstetrics & Gynecology

## 2019-10-07 ENCOUNTER — Ambulatory Visit (INDEPENDENT_AMBULATORY_CARE_PROVIDER_SITE_OTHER): Payer: Medicaid Other | Admitting: Obstetrics & Gynecology

## 2019-10-07 VITALS — BP 131/82 | HR 98 | Ht 63.0 in | Wt 193.0 lb

## 2019-10-07 DIAGNOSIS — N871 Moderate cervical dysplasia: Secondary | ICD-10-CM

## 2019-10-07 DIAGNOSIS — Z01812 Encounter for preprocedural laboratory examination: Secondary | ICD-10-CM

## 2019-10-07 DIAGNOSIS — R87613 High grade squamous intraepithelial lesion on cytologic smear of cervix (HGSIL): Secondary | ICD-10-CM | POA: Diagnosis present

## 2019-10-07 NOTE — Patient Instructions (Signed)
Loop Electrosurgical Excision Procedure Loop electrosurgical excision procedure (LEEP) is the cutting and removal (excision) of tissue from the cervix. The cervix is the bottom part of the uterus that opens into the vagina. The tissue that is removed from the cervix is examined to see if there are precancerous cells or cancer cells present. LEEP may be done when:  You have abnormal bleeding from your cervix.  You have an abnormal Pap test result.  Your health care provider finds an abnormality on your cervix during a pelvic exam. LEEP typically only takes a few minutes and is often done in the health care provider's office. The procedure is safe for women who are trying to get pregnant. However, the procedure is usually not done during a menstrual period or during pregnancy. Tell a health care provider about:  Any allergies you have.  All medicines you are taking, including vitamins, herbs, eye drops, creams, and over-the-counter medicines.  Any blood disorders you have.  Any medical conditions you have, including current or past vaginal infections such as herpes or sexually-transmitted infections (STIs).  Whether you are pregnant or may be pregnant.  Whether or not you are having vaginal bleeding on the day of the procedure. What are the risks? Generally, this is a safe procedure. However, problems may occur, including:  Infection.  Bleeding.  Allergic reactions to medicines.  Changes or scarring in the cervix.  Increased risk of early (preterm) labor in future pregnancies. What happens before the procedure?  Ask your health care provider about: ? Changing or stopping your regular medicines. This is especially important if you are taking diabetes medicines or blood thinners. ? Taking medicines such as aspirin and ibuprofen. These medicines can thin your blood. Do not take these medicines unless your health care provider tells you to take them. ? Taking over-the-counter  medicines, vitamins, herbs, and supplements.  Your health care provider may recommend that you take pain medicine before the procedure.  Ask your health care provider if you should plan to have someone take you home after the procedure. What happens during the procedure?   An instrument called a speculum will be placed in your vagina. This will allow your health care provider to see your cervix.  You will be given a medicine to numb the area (local anesthetic). The medicine will be injected into your cervix and the surrounding area.  A solution will be applied to your cervix. This solution will help the health care provider find the abnormal cells that need to be removed.  A thin wire loop will be passed through your vagina. The wire will be used to burn (cauterize) the cervical tissue with an electrical current.  You may feel faint during the procedure. Tell your health care provider right away if you feel this way.  The abnormal cervical tissue will be removed.  Any open blood vessels will be cauterized to prevent bleeding.  A paste may be applied to the cauterized area of your cervix to help prevent bleeding.  The sample of cervical tissue will be examined under a microscope. The procedure may vary among health care providers and hospitals. What can I expect after the procedure? After the procedure, it is common to have:  Mild abdominal cramps that are similar to menstrual cramps. These may last for up to 1 week.  A small amount of pink-tinged or bloody vaginal discharge, including light to moderate bleeding, for 1-2 weeks.  A dark-colored discharge coming from your vagina. This is from   the paste that was used on the cervix to prevent bleeding. It is up to you to get the results of your procedure. Ask your health care provider, or the department that is doing the procedure, when your results will be ready. Follow these instructions at home:  Take over-the-counter and  prescription medicines only as told by your health care provider.  Return to your normal activities as told by your health care provider. Ask your health care provider what activities are safe for you.  Do not put anything in your vagina for 2 weeks after the procedure or until your health care provider says that it is okay. This includes tampons, creams, and douches.  Do not have sex until your health care provider approves.  Keep all follow-up visits as told by your health care provider. This is important. Contact a health care provider if you:  Have a fever or chills.  Feel unusually weak.  Have vaginal bleeding that is heavier or longer than a normal menstrual cycle. A sign of this can be soaking a pad with blood or bleeding with clots.  Develop a bad smelling vaginal discharge.  Have severe abdominal pain or cramping. Summary  Loop electrosurgical excision procedure (LEEP) is the removal of tissue from the cervix. The removed tissue will be checked for precancerous cells or cancer cells.  LEEP typically only takes a few minutes and is often done in the health care provider's office.  Do not put anything in your vagina for 2 weeks after the procedure or until your health care provider says that it is okay. This includes tampons, creams, and douches.  Keep all follow-up visits as told by your health care provider. Ask your health care provider, or the department that is doing the procedure, when your results will be ready. This information is not intended to replace advice given to you by your health care provider. Make sure you discuss any questions you have with your health care provider. Document Revised: 09/12/2018 Document Reviewed: 09/12/2018 Elsevier Patient Education  2020 [employer] Inc. LEEP POST-PROCEDURE INSTRUCTIONS  1. You may take Ibuprofen, Aleve or Tylenol for pain if needed.  Cramping is normal.  2. You will have black and/or bloody discharge at first.  This  will lighten and then turn clear before completely resolving.  This will take 2 to 3 weeks.  3. Put nothing in your vagina until the bleeding or discharge stops (usually 2 or3 days).  4. You need to call if you have redness around the biopsy site, if there is any unusual draining, if the bleeding is heavy, or if you are concerned.  5. Shower or bathe as normal  6. We will call you within one week with results or we will discuss the results at your follow-up appointment if needed.  7. You will need to return for a follow-up Pap smear as directed by your physician. 

## 2019-10-07 NOTE — Progress Notes (Signed)
Pap smear and colposcopy reviewed.   Pap 08/05/2019 Colpo Biopsy/ECC 09/09/2019 FINAL MICROSCOPIC DIAGNOSIS:   A. 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   B. ENDOCERVIX, CURETTAGE:  - Benign endocervical mucosa.   GROSS DESCRIPTION:  A. Received in formalin are tan, soft tissue fragments that are  submitted in toto. Number: Multiple fragments size: 0.1 to 0.5 cm  blocks: 1  B. Received in formalin is blood tinged mucus that is entirely  submitted in one block. Volume: 1.5 x 1 x 0.1 cm (1 B) (AK 09/10/2019)   Risks, benefits, alternatives, and limitations of procedure explained to patient, including pain, bleeding, infection, failure to remove abnormal tissue and failure to cure dysplasia, need for repeat procedures, damage to pelvic organs, cervical incompetence.  Role of HPV,cervical dysplasia and need for close followup was empasized. Informed written consent was obtained. All questions were answered. Time out performed.  Procedure: The patient was placed in lithotomy position and the bivalved coated speculum was placed in the patient's vagina. A grounding pad placed on the patient. Local anesthesia was administered via an intracervical block using 10cc of 2% Lidocaine with epinephrine. The suction was turned on and the Medium 1X Fisher Cone Biopsy Excisor on 50 Watts of cutting current was used to excise the area of decreased uptake and excise the entire transformation zone. Excellent hemostasis was achieved using roller ball coagulation set at 60 Watts coagulation current. Monsel's solution was then applied and excellent hemostasis was noted.  The speculum was removed from the vagina. Specimens were sent to pathology. The patient tolerated the procedure well. Post-operative instructions given to patient, including instruction to seek medical attention for persistent bright red bleeding, fever, abdominal/pelvic  pain, dysuria, nausea or vomiting. She was also told about the possibility of having copious yellow to black tinged discharge. She was counseled to avoid anything in the vagina (sex/douching/tampons) for 4 weeks. F/u in 4 weeks or sooner prn for  post-operative check to review results and assess wound healing.   Plato Alspaugh L. Harraway-Smith, M.D., [facility] L. Harraway-Smith, M.D., [facility]

## 2019-10-09 LAB — SURGICAL PATHOLOGY

## 2019-10-12 ENCOUNTER — Encounter (HOSPITAL_BASED_OUTPATIENT_CLINIC_OR_DEPARTMENT_OTHER): Payer: Self-pay | Admitting: *Deleted

## 2019-10-12 ENCOUNTER — Other Ambulatory Visit: Payer: Self-pay

## 2019-10-12 ENCOUNTER — Emergency Department (HOSPITAL_BASED_OUTPATIENT_CLINIC_OR_DEPARTMENT_OTHER)
Admission: EM | Admit: 2019-10-12 | Discharge: 2019-10-13 | Disposition: A | Discharge status: Home / Self Care | Payer: Medicaid Other | Attending: Emergency Medicine | Admitting: Emergency Medicine

## 2019-10-12 DIAGNOSIS — Z79899 Other long term (current) drug therapy: Secondary | ICD-10-CM | POA: Insufficient documentation

## 2019-10-12 DIAGNOSIS — R42 Dizziness and giddiness: Secondary | ICD-10-CM | POA: Diagnosis present

## 2019-10-12 DIAGNOSIS — I951 Orthostatic hypotension: Secondary | ICD-10-CM | POA: Diagnosis not present

## 2019-10-12 DIAGNOSIS — R55 Syncope and collapse: Secondary | ICD-10-CM

## 2019-10-12 DIAGNOSIS — R000 Tachycardia, unspecified: Secondary | ICD-10-CM | POA: Diagnosis not present

## 2019-10-12 LAB — CBC WITH DIFFERENTIAL/PLATELET
Abs Immature Granulocytes: 0.03 10*3/uL (ref 0.00–0.07)
Basophils Absolute: 0 10*3/uL (ref 0.0–0.1)
Basophils Relative: 0 %
Eosinophils Absolute: 0 10*3/uL (ref 0.0–0.5)
Eosinophils Relative: 0 %
HCT: 44.8 % (ref 36.0–46.0)
Hemoglobin: 15.1 g/dL — ABNORMAL HIGH (ref 12.0–15.0)
Immature Granulocytes: 0 %
Lymphocytes Relative: 18 %
Lymphs Abs: 1.6 10*3/uL (ref 0.7–4.0)
MCH: 29.4 pg (ref 26.0–34.0)
MCHC: 33.7 g/dL (ref 30.0–36.0)
MCV: 87.3 fL (ref 80.0–100.0)
Monocytes Absolute: 0.5 10*3/uL (ref 0.1–1.0)
Monocytes Relative: 6 %
Neutro Abs: 6.6 10*3/uL (ref 1.7–7.7)
Neutrophils Relative %: 76 %
Platelets: 342 10*3/uL (ref 150–400)
RBC: 5.13 MIL/uL — ABNORMAL HIGH (ref 3.87–5.11)
RDW: 12.7 % (ref 11.5–15.5)
WBC: 8.8 10*3/uL (ref 4.0–10.5)
nRBC: 0 % (ref 0.0–0.2)

## 2019-10-12 LAB — BASIC METABOLIC PANEL
Anion gap: 10 (ref 5–15)
BUN: 11 mg/dL (ref 6–20)
CO2: 25 mmol/L (ref 22–32)
Calcium: 9 mg/dL (ref 8.9–10.3)
Chloride: 102 mmol/L (ref 98–111)
Creatinine, Ser: 0.6 mg/dL (ref 0.44–1.00)
GFR calc Af Amer: >60 mL/min (ref >60)
GFR calc non Af Amer: >60 mL/min (ref >60)
Glucose, Bld: 98 mg/dL (ref 70–99)
Potassium: 3.6 mmol/L (ref 3.5–5.1)
Sodium: 137 mmol/L (ref 135–145)

## 2019-10-12 LAB — URINALYSIS, ROUTINE W REFLEX MICROSCOPIC
Bilirubin Urine: NEGATIVE
Glucose, UA: NEGATIVE mg/dL
Hgb urine dipstick: NEGATIVE
Ketones, ur: NEGATIVE mg/dL
Leukocytes,Ua: NEGATIVE
Nitrite: NEGATIVE
Protein, ur: NEGATIVE mg/dL
Specific Gravity, Urine: 1.01 (ref 1.005–1.030)
pH: 7 (ref 5.0–8.0)

## 2019-10-12 LAB — CBG MONITORING, ED: Glucose-Capillary: 101 mg/dL — ABNORMAL HIGH (ref 70–99)

## 2019-10-12 MED ORDER — SODIUM CHLORIDE 0.9 % IV BOLUS: 1000.0000 mL | Freq: Once | INTRAVENOUS | Status: DC

## 2019-10-12 MED ORDER — SODIUM CHLORIDE 0.9 % IV BOLUS
1000.0000 mL | Freq: Once | INTRAVENOUS | Status: AC
  Administered 2019-10-12: 1000 mL via INTRAVENOUS

## 2019-10-12 MED ORDER — LORAZEPAM 2 MG/ML IJ SOLN
1.0000 mg | Freq: Once | INTRAMUSCULAR | Status: AC
  Administered 2019-10-12: 1 mg via INTRAVENOUS
  Filled 2019-10-12: qty 1

## 2019-10-12 MED ORDER — METOCLOPRAMIDE HCL 5 MG/ML IJ SOLN
10.0000 mg | Freq: Once | INTRAMUSCULAR | Status: AC
  Administered 2019-10-12: 10 mg via INTRAVENOUS
  Filled 2019-10-12: qty 2

## 2019-10-12 MED ORDER — DIPHENHYDRAMINE HCL 50 MG/ML IJ SOLN
25.0000 mg | Freq: Once | INTRAMUSCULAR | Status: AC
  Administered 2019-10-12: 25 mg via INTRAVENOUS
  Filled 2019-10-12: qty 1

## 2019-10-12 NOTE — ED Notes (Signed)
Gave patient water for PO challenge

## 2019-10-12 NOTE — ED Provider Notes (Signed)
[facility] EMERGENCY DEPARTMENT Provider Note   CSN: [id number] Arrival date & time: 10/12/19  1904     History No chief complaint on file.   Cindy Martinez is a 24 y.o. female.  Patient with recent LEEP procedure approximately 5 days ago presents to the emergency department with complaint of nausea, lightheadedness and near syncope and feeling jittery.  Patient states that this afternoon she had an episode of nausea without vomiting at approximately 1 PM.  After this time she began to feel lightheaded and jittery.  She did not have any full syncope.  No chest pain or significant shortness of breath.  Patient has not felt hungry and has not eaten much today.  She reports recent bleeding and spotting post LEEP procedure and then several days of heavier bleeding, going through a pad every couple of hours at times, which she attributed with her menstrual period.  She had some lower abdominal cramping consistent with menstrual periods with this as well.  No more severe focal tenderness or pain. Patient denies risk factors for pulmonary embolism including: unilateral leg swelling, history of DVT/PE/other blood clots, use of exogenous hormones, recent immobilizations, recent surgery, recent travel (>4hr segment), malignancy, hemoptysis.  No history of cardiac problems.  No previous abdominal surgeries other than tubal ligation.          Past Medical History:  Diagnosis Date  . Vaginal Pap smear, abnormal     There are no problems to display for this patient.   Past Surgical History:  Procedure Laterality Date  . LEEP N/A      OB History    Gravida  2   Para  2   Term  2   Preterm      AB      Living  1     SAB      TAB      Ectopic      Multiple      Live Births  1           No family history on file.  Social History   Tobacco Use  . Smoking status: Never Smoker  . Smokeless tobacco: Never Used  Substance Use Topics  . Alcohol use: Yes  .  Drug use: No    Home Medications Prior to Admission medications   Medication Sig Start Date End Date Taking? Authorizing Provider  indapamide (LOZOL) 1.25 MG tablet Take 1.25 mg by mouth daily.   Yes [provider]  ibuprofen (ADVIL,MOTRIN) 800 MG tablet Take 1 tablet (800 mg total) by mouth every 8 (eight) hours as needed (for pain). Patient not taking: Reported on 08/11/2019 01/22/16   Molpus, Jonny Ruiz, MD  naproxen (NAPROSYN) 500 MG tablet Take 1 tablet (500 mg total) by mouth 2 (two) times daily as needed for moderate pain. As needed for pain. Please take with meals. 08/10/19   Willodean Rosenthal, MD  traMADol (ULTRAM) 50 MG tablet Take 1 tablet (50 mg total) by mouth every 6 (six) hours as needed. 07/25/19   Bethel Born, PA-C    Allergies    Patient has no known allergies.  Review of Systems   Review of Systems  Constitutional: Negative for fever.  HENT: Negative for rhinorrhea and sore throat.   Eyes: Negative for redness.  Respiratory: Negative for cough and shortness of breath.   Cardiovascular: Negative for chest pain.  Gastrointestinal: Positive for nausea. Negative for abdominal pain, diarrhea and vomiting.  Genitourinary: Negative for dysuria.  Musculoskeletal: Negative for myalgias.  Skin: Negative for rash.  Neurological: Positive for tremors and light-headedness. Negative for syncope and headaches.    Physical Exam Updated Vital Signs BP (!) 153/93   Pulse (!) 118   Temp 98.9 F (37.2 C) (Oral)   Resp 20   Ht 5\' 3"  (1.6 m)   Wt 81.6 kg   LMP 10/05/2019   SpO2 100%   BMI 31.89 kg/m   Physical Exam Vitals and nursing note reviewed.  Constitutional:      Appearance: She is well-developed.  HENT:     Head: Normocephalic and atraumatic.     Nose: Nose normal.     Mouth/Throat:     Mouth: Mucous membranes are moist.  Eyes:     General:        Right eye: No discharge.        Left eye: No discharge.     Conjunctiva/sclera: Conjunctivae  normal.  Cardiovascular:     Rate and Rhythm: Regular rhythm. Tachycardia present.     Heart sounds: Normal heart sounds.  Pulmonary:     Effort: Pulmonary effort is normal.     Breath sounds: Normal breath sounds.  Abdominal:     Palpations: Abdomen is soft.     Tenderness: There is abdominal tenderness. There is no guarding or rebound.     Comments: Minimal pelvic tenderness, midline to right.  Musculoskeletal:     Cervical back: Normal range of motion and neck supple.  Skin:    General: Skin is warm and dry.  Neurological:     Mental Status: She is alert.     ED Results / Procedures / Treatments   Labs (all labs ordered are listed, but only abnormal results are displayed) Labs Reviewed  CBC WITH DIFFERENTIAL/PLATELET - Abnormal; Notable for the following components:      Result Value   RBC 5.13 (*)    Hemoglobin 15.1 (*)    All other components within normal limits  CBG MONITORING, ED - Abnormal; Notable for the following components:   Glucose-Capillary 101 (*)    All other components within normal limits  BASIC METABOLIC PANEL  URINALYSIS, ROUTINE W REFLEX MICROSCOPIC  D-DIMER, QUANTITATIVE (NOT AT [facility])    ED ECG REPORT   Date: 10/12/2019  Rate: 110  Rhythm: sinus tachycardia  QRS Axis: normal  Intervals: normal  ST/T Wave abnormalities: normal  Conduction Disutrbances:none  Narrative Interpretation:   Old EKG Reviewed: none available  I have personally reviewed the EKG tracing and agree with the computerized printout as noted.  Radiology No results found.  Procedures Procedures (including critical care time)  Medications Ordered in ED Medications  sodium chloride 0.9 % bolus 1,000 mL (has no administration in time range)    ED Course  I have reviewed the triage vital signs and the nursing notes.  Pertinent labs & imaging results that were available during my care of the patient were reviewed by me and considered in my medical decision making (see  chart for details).  Patient seen and examined. Work-up initiated.  Vital signs reviewed and are as follows: BP (!) 153/93   Pulse (!) 118   Temp 98.9 F (37.2 C) (Oral)   Resp 20   Ht 5\' 3"  (1.6 m)   Wt 81.6 kg   LMP 10/05/2019   SpO2 100%   BMI 31.89 kg/m   Orthostatic VS for the past 24 hrs:  BP- Lying Pulse- Lying BP- Sitting Pulse- Sitting BP- Standing  at 0 minutes Pulse- Standing at 0 minutes  10/12/19 2112 137/80 108 143/88 112 138/81 127    Patient with pulse rate from 108 to 127 with standing.  Given persistent tachycardia and orthostasis, will give fluid bolus.  11:13 PM Pt was treated with reglan and benadryl for HA.  Headache is improved.  Heart rate still 110-120, as low as 100 when not in the room.  Patient signed out to Dr. Elesa Massed at shift change.  Given persistently elevated heart rate, will check D-dimer.  Ativan ordered.  Second liter of fluids ordered.  Patient updated on plan and agrees.  She looks comfortable.      MDM Rules/Calculators/A&P                        Final Clinical Impression(s) / ED Diagnoses Final diagnoses:  Near syncope  Sinus tachycardia    Rx / DC Orders ED Discharge Orders    None       Renne Crigler, PA-C 10/12/19 2321    Vanetta Mulders, MD 10/14/19 1616

## 2019-10-12 NOTE — ED Triage Notes (Signed)
Lightheaded, nausea, and shaky since this afternoon. No appetite.

## 2019-10-12 NOTE — ED Provider Notes (Signed)
11:15 PM  Assumed care. Patient is a 24 year old female who presents to the emergency department with tachycardia, near syncopal event. No chest pain or shortness of breath. No calf tenderness or calf swelling. Labs unremarkable. Did also have mild headache that has improved with migraine cocktail. Still however tachycardic after 1 L of fluid. Will give another liter of fluid, IV Ativan for anxiety and add on D-dimer to rule out PE.  12:10 AM  Pt's D-dimer is negative.  Patient's heart rate currently 88 with a blood pressure of 118/80 and satting 99% on room air.  She has no complaints at this time.  Encouraged rest and increase oral intake.  I feel she is safe to be discharged home.  She has someone who can drive her.    At this time, I do not feel there is any life-threatening condition present. I have reviewed, interpreted and discussed all results (EKG, imaging, lab, urine as appropriate) and exam findings with patient/family. I have reviewed nursing notes and appropriate previous records.  I feel the patient is safe to be discharged home without further emergent workup and can continue workup as an outpatient as needed. Discussed usual and customary return precautions. Patient/family verbalize understanding and are comfortable with this plan.  Outpatient follow-up has been provided as needed. All questions have been answered.    EKG Interpretation  Date/Time:  Monday October 12 2019 20:52:48 EST Ventricular Rate:  110 PR Interval:    QRS Duration: 90 QT Interval:  327 QTC Calculation: 443 R Axis:   78 Text Interpretation: Sinus tachycardia Right atrial enlargement No old tracing to compare Confirmed by Ojas Coone, Baxter Hire [id number]) on 10/12/2019 11:15:28 PM         Valeen Borys, Layla Maw, DO 10/13/19 2993

## 2019-10-13 LAB — PREGNANCY, URINE: Preg Test, Ur: NEGATIVE

## 2019-10-13 LAB — D-DIMER, QUANTITATIVE: D-Dimer, Quant: <0.27 ug/mL-FEU (ref 0.00–0.50)

## 2019-10-13 NOTE — ED Notes (Signed)
Waiting for husband to arrive, not driving home

## 2019-10-13 NOTE — Discharge Instructions (Addendum)
Your labs, urine today were normal.  Your heart rate has been slightly fast.  I recommend you increase your water intake at home and avoid caffeine and alcohol as these can cause you to be more dehydrated.  I recommend drinking at least 60 ounces of water a day.  Steps to find a Primary Care Provider (PCP):  Call [phone] or [phone] to access "[place] Find a Doctor Service."  2.  You may also go on the [facility] website at [email or web address]  3.  [facility] also frequently accepts new patients.  [facility]  [address] [place] [address] [phone]  4.  There are also multiple [facility], [facility] and [facility] practices throughout the [place] that are frequently accepting new patients. You may find a clinic that is close to your home and contact them.  [facility] [email or web address] [phone]  [email or web address]  [facility] [email or web address] [phone]  [facility] [email or web address] [phone]  5.  Local Health Departments also can provide primary care services.  [facility]  [address] [place] [address] [phone]  [facility] Department [address] [place] [address] [phone]  [facility] [address] [place] [address]  [place] [address] [phone]

## 2019-10-15 ENCOUNTER — Telehealth: Payer: Self-pay

## 2019-10-15 NOTE — Telephone Encounter (Signed)
-----   Message from Willodean Rosenthal, MD sent at 10/15/2019  3:29 PM EST ----- Michael Boston,  This is what I just sent to her. Please call her with the following:  Hello Ms Hashimi,   I have reviewed your biopsy results. Margins NOT involved means that the entire lesion was removed. We will see you back for your 4 week check or sooner as needed.   Dr. Jules Schick,  Cl[facility]-S

## 2019-10-15 NOTE — Telephone Encounter (Signed)
Called pt to discuss LEEP results. Per Dr. Erin Fulling, margins not involved means the entire lesion was removed. Pt advised to f/u in 4 weeks or sooner if needed. Understanding was voiced. Cindy Martinez l Keshona Kartes, CMA

## 2019-10-16 ENCOUNTER — Emergency Department (HOSPITAL_BASED_OUTPATIENT_CLINIC_OR_DEPARTMENT_OTHER)
Admission: EM | Admit: 2019-10-16 | Discharge: 2019-10-16 | Disposition: A | Discharge status: Home / Self Care | Payer: Medicaid Other | Attending: Emergency Medicine | Admitting: Emergency Medicine

## 2019-10-16 ENCOUNTER — Other Ambulatory Visit: Payer: Self-pay

## 2019-10-16 ENCOUNTER — Encounter (HOSPITAL_BASED_OUTPATIENT_CLINIC_OR_DEPARTMENT_OTHER): Payer: Self-pay | Admitting: Emergency Medicine

## 2019-10-16 DIAGNOSIS — E876 Hypokalemia: Secondary | ICD-10-CM

## 2019-10-16 DIAGNOSIS — Z79899 Other long term (current) drug therapy: Secondary | ICD-10-CM | POA: Diagnosis not present

## 2019-10-16 DIAGNOSIS — G43909 Migraine, unspecified, not intractable, without status migrainosus: Secondary | ICD-10-CM | POA: Diagnosis not present

## 2019-10-16 DIAGNOSIS — R110 Nausea: Secondary | ICD-10-CM

## 2019-10-16 DIAGNOSIS — R519 Headache, unspecified: Secondary | ICD-10-CM

## 2019-10-16 LAB — COMPREHENSIVE METABOLIC PANEL
ALT: 29 U/L (ref 0–44)
AST: 21 U/L (ref 15–41)
Albumin: 4.4 g/dL (ref 3.5–5.0)
Alkaline Phosphatase: 66 U/L (ref 38–126)
Anion gap: 10 (ref 5–15)
BUN: 16 mg/dL (ref 6–20)
CO2: 26 mmol/L (ref 22–32)
Calcium: 9.6 mg/dL (ref 8.9–10.3)
Chloride: 102 mmol/L (ref 98–111)
Creatinine, Ser: 0.7 mg/dL (ref 0.44–1.00)
GFR calc Af Amer: >60 mL/min (ref >60)
GFR calc non Af Amer: >60 mL/min (ref >60)
Glucose, Bld: 122 mg/dL — ABNORMAL HIGH (ref 70–99)
Potassium: 3.2 mmol/L — ABNORMAL LOW (ref 3.5–5.1)
Sodium: 138 mmol/L (ref 135–145)
Total Bilirubin: 1.1 mg/dL (ref 0.3–1.2)
Total Protein: 7.8 g/dL (ref 6.5–8.1)

## 2019-10-16 LAB — URINALYSIS, MICROSCOPIC (REFLEX): Bacteria, UA: NONE SEEN

## 2019-10-16 LAB — CBC WITH DIFFERENTIAL/PLATELET
Abs Immature Granulocytes: 0.04 10*3/uL (ref 0.00–0.07)
Basophils Absolute: 0 10*3/uL (ref 0.0–0.1)
Basophils Relative: 0 %
Eosinophils Absolute: 0.1 10*3/uL (ref 0.0–0.5)
Eosinophils Relative: 1 %
HCT: 42.7 % (ref 36.0–46.0)
Hemoglobin: 14.3 g/dL (ref 12.0–15.0)
Immature Granulocytes: 0 %
Lymphocytes Relative: 24 %
Lymphs Abs: 2.7 10*3/uL (ref 0.7–4.0)
MCH: 29.4 pg (ref 26.0–34.0)
MCHC: 33.5 g/dL (ref 30.0–36.0)
MCV: 87.7 fL (ref 80.0–100.0)
Monocytes Absolute: 0.6 10*3/uL (ref 0.1–1.0)
Monocytes Relative: 5 %
Neutro Abs: 7.6 10*3/uL (ref 1.7–7.7)
Neutrophils Relative %: 70 %
Platelets: 366 10*3/uL (ref 150–400)
RBC: 4.87 MIL/uL (ref 3.87–5.11)
RDW: 12.4 % (ref 11.5–15.5)
WBC: 11 10*3/uL — ABNORMAL HIGH (ref 4.0–10.5)
nRBC: 0 % (ref 0.0–0.2)

## 2019-10-16 LAB — URINALYSIS, ROUTINE W REFLEX MICROSCOPIC
Bilirubin Urine: NEGATIVE
Glucose, UA: NEGATIVE mg/dL
Ketones, ur: NEGATIVE mg/dL
Leukocytes,Ua: NEGATIVE
Nitrite: NEGATIVE
Protein, ur: NEGATIVE mg/dL
Specific Gravity, Urine: 1.01 (ref 1.005–1.030)
pH: 7 (ref 5.0–8.0)

## 2019-10-16 LAB — PREGNANCY, URINE: Preg Test, Ur: NEGATIVE

## 2019-10-16 MED ORDER — SODIUM CHLORIDE 0.9 % IV BOLUS
1000.0000 mL | Freq: Once | INTRAVENOUS | Status: AC
  Administered 2019-10-16: 01:00:00 1000 mL via INTRAVENOUS

## 2019-10-16 MED ORDER — PROCHLORPERAZINE EDISYLATE 10 MG/2ML IJ SOLN
10.0000 mg | Freq: Once | INTRAMUSCULAR | Status: AC
  Administered 2019-10-16: 01:00:00 10 mg via INTRAVENOUS
  Filled 2019-10-16: qty 2

## 2019-10-16 MED ORDER — SODIUM CHLORIDE 0.9 % IV BOLUS: 500.0000 mL | Freq: Once | INTRAVENOUS | Status: DC

## 2019-10-16 MED ORDER — POTASSIUM CHLORIDE CRYS ER 20 MEQ PO TBCR
40.0000 meq | EXTENDED_RELEASE_TABLET | Freq: Once | ORAL | Status: AC
  Administered 2019-10-16: 02:00:00 40 meq via ORAL
  Filled 2019-10-16: qty 2

## 2019-10-16 MED ORDER — DIPHENHYDRAMINE HCL 50 MG/ML IJ SOLN
25.0000 mg | Freq: Once | INTRAMUSCULAR | Status: AC
  Administered 2019-10-16: 25 mg via INTRAVENOUS
  Filled 2019-10-16: qty 1

## 2019-10-16 MED ORDER — PROCHLORPERAZINE MALEATE 10 MG PO TABS: 10.0000 mg | ORAL_TABLET | Freq: Four times a day (QID) | ORAL | 0 refills | Status: DC | PRN

## 2019-10-16 MED ORDER — SODIUM CHLORIDE 0.9 % IV BOLUS: 1000.0000 mL | Freq: Once | INTRAVENOUS | Status: DC

## 2019-10-16 MED ORDER — DEXAMETHASONE SODIUM PHOSPHATE 10 MG/ML IJ SOLN
10.0000 mg | Freq: Once | INTRAMUSCULAR | Status: AC
  Administered 2019-10-16: 02:00:00 10 mg via INTRAVENOUS
  Filled 2019-10-16: qty 1

## 2019-10-16 NOTE — Discharge Instructions (Addendum)
Drink plenty of fluids.  Return if you are having any problems. 

## 2019-10-16 NOTE — ED Triage Notes (Signed)
Pt reports continued headache since Monday. Reports nausea as well. Seen in our ED for same.

## 2019-10-16 NOTE — ED Provider Notes (Signed)
[facility] EMERGENCY DEPARTMENT Provider Note   CSN: [id number] Arrival date & time: 10/16/19  0004   History Chief Complaint  Patient presents with  . Headache    Cindy Martinez is a 24 y.o. female.  The history is provided by the patient.  Headache She had been seen here 4 days ago with nausea, dizziness with near syncope, headache and states that symptoms have persisted.  She complains of a bitemporal headache which is described as a throbbing sensation and she rates it at 5/10.  There is no associated visual change and no photophobia or phonophobia.  She has fairly constant nausea but has been able to eat.  She has not vomited.  She is feeling generally weak and at times has had a near syncopal sensation but has not actually passed out.  She denies fever, chills, sweats.  She denies loss of sense of smell or taste.  She denies exposure to COVID-19.  Past Medical History:  Diagnosis Date  . Vaginal Pap smear, abnormal     There are no problems to display for this patient.   Past Surgical History:  Procedure Laterality Date  . LEEP N/A      OB History    Gravida  2   Para  2   Term  2   Preterm      AB      Living  1     SAB      TAB      Ectopic      Multiple      Live Births  1           No family history on file.  Social History   Tobacco Use  . Smoking status: Never Smoker  . Smokeless tobacco: Never Used  Substance Use Topics  . Alcohol use: Yes  . Drug use: No    Home Medications Prior to Admission medications   Medication Sig Start Date End Date Taking? Authorizing Provider  ibuprofen (ADVIL,MOTRIN) 800 MG tablet Take 1 tablet (800 mg total) by mouth every 8 (eight) hours as needed (for pain). Patient not taking: Reported on 08/11/2019 01/22/16   Molpus, John, MD  indapamide (LOZOL) 1.25 MG tablet Take 1.25 mg by mouth daily.    [provider]  naproxen (NAPROSYN) 500 MG tablet Take 1 tablet (500 mg total) by  mouth 2 (two) times daily as needed for moderate pain. As needed for pain. Please take with meals. 08/10/19   Willodean Rosenthal, MD  traMADol (ULTRAM) 50 MG tablet Take 1 tablet (50 mg total) by mouth every 6 (six) hours as needed. 07/25/19   Bethel Born, PA-C    Allergies    Patient has no known allergies.  Review of Systems   Review of Systems  Neurological: Positive for headaches.  All other systems reviewed and are negative.   Physical Exam Updated Vital Signs BP (!) 183/93 (BP Location: Right Arm)   Pulse (!) 122   Temp 98.3 F (36.8 C) (Oral)   Resp 18   Ht 5\' 3"  (1.6 m)   Wt 81.6 kg   LMP 10/05/2019   SpO2 95%   BMI 31.89 kg/m   Physical Exam Vitals and nursing note reviewed.   24 year old female, resting comfortably and in no acute distress. Vital signs are significant for elevated blood pressure and heart rate. Oxygen saturation is 95%, which is normal. Head is normocephalic and atraumatic. PERRLA, EOMI. Oropharynx is clear. Neck  is nontender and supple without adenopathy or JVD. Back is nontender and there is no CVA tenderness. Lungs are clear without rales, wheezes, or rhonchi. Chest is nontender. Heart has regular rate and rhythm without murmur. Abdomen is soft, flat, nontender without masses or hepatosplenomegaly and peristalsis is hypoactive. Extremities have no cyanosis or edema, full range of motion is present. Skin is warm and dry without rash. Neurologic: Mental status is normal, cranial nerves are intact, there are no motor or sensory deficits.  ED Results / Procedures / Treatments   Labs (all labs ordered are listed, but only abnormal results are displayed) Labs Reviewed  URINALYSIS, ROUTINE W REFLEX MICROSCOPIC - Abnormal; Notable for the following components:      Result Value   Color, Urine STRAW (*)    APPearance CLOUDY (*)    Hgb urine dipstick TRACE (*)    All other components within normal limits  CBC WITH  DIFFERENTIAL/PLATELET - Abnormal; Notable for the following components:   WBC 11.0 (*)    All other components within normal limits  COMPREHENSIVE METABOLIC PANEL - Abnormal; Notable for the following components:   Potassium 3.2 (*)    Glucose, Bld 122 (*)    All other components within normal limits  PREGNANCY, URINE  URINALYSIS, MICROSCOPIC (REFLEX)   Procedures Procedures   Medications Ordered in ED Medications  sodium chloride 0.9 % bolus 1,000 mL (1,000 mLs Intravenous Refused 10/16/19 0210)  sodium chloride 0.9 % bolus 1,000 mL (0 mLs Intravenous Stopped 10/16/19 0138)  prochlorperazine (COMPAZINE) injection 10 mg (10 mg Intravenous Given 10/16/19 0042)  diphenhydrAMINE (BENADRYL) injection 25 mg (25 mg Intravenous Given 10/16/19 0042)  potassium chloride SA (KLOR-CON) CR tablet 40 mEq (40 mEq Oral Given 10/16/19 0137)  dexamethasone (DECADRON) injection 10 mg (10 mg Intravenous Given 10/16/19 0206)    ED Course  I have reviewed the triage vital signs and the nursing notes.  Pertinent labs & imaging results that were available during my care of the patient were reviewed by me and considered in my medical decision making (see chart for details).  MDM Rules/Calculators/A&P Multiple complaints of uncertain cause.  Old records are reviewed confirming ED visit on February 8 for near syncope and nausea.  Exam here is benign except for blood pressure and heart rate.  Will check screening labs and give IV fluids as well as prochlorperazine to treat headache and nausea.  Labs show mild hypokalemia and she is given a dose of oral potassium.  She felt considerably better following IV fluids and prochlorperazine.  Heart rate has come down and blood pressure has returned to normal.  I did recommend some additional IV fluids but patient stated that she was anxious to get home and did not want any additional fluids.  She is discharged with prescription for prochlorperazine.  Return precautions  discussed.  Final Clinical Impression(s) / ED Diagnoses Final diagnoses:  Acute nonintractable headache, unspecified headache type  Nausea  Hypokalemia    Rx / DC Orders ED Discharge Orders         Ordered    prochlorperazine (COMPAZINE) 10 MG tablet  Every 6 hours PRN     10/16/19 0998           Delora Fuel, MD 33/82/50 [phone]

## 2019-10-23 ENCOUNTER — Other Ambulatory Visit: Payer: Self-pay

## 2019-10-23 ENCOUNTER — Ambulatory Visit (INDEPENDENT_AMBULATORY_CARE_PROVIDER_SITE_OTHER): Payer: Medicaid Other | Admitting: Family Medicine

## 2019-10-23 ENCOUNTER — Encounter: Payer: Self-pay | Admitting: Family Medicine

## 2019-10-23 ENCOUNTER — Other Ambulatory Visit (HOSPITAL_COMMUNITY)
Admission: RE | Admit: 2019-10-23 | Discharge: 2019-10-23 | Disposition: A | Discharge status: Home / Self Care | Payer: Medicaid Other | Source: Ambulatory Visit | Attending: Family Medicine | Admitting: Family Medicine

## 2019-10-23 VITALS — BP 139/89 | HR 99 | Wt 193.0 lb

## 2019-10-23 DIAGNOSIS — N939 Abnormal uterine and vaginal bleeding, unspecified: Secondary | ICD-10-CM

## 2019-10-23 DIAGNOSIS — Z9889 Other specified postprocedural states: Secondary | ICD-10-CM

## 2019-10-23 DIAGNOSIS — N898 Other specified noninflammatory disorders of vagina: Secondary | ICD-10-CM | POA: Diagnosis present

## 2019-10-23 NOTE — Progress Notes (Signed)
Patient complains of fishy odor

## 2019-10-23 NOTE — Progress Notes (Signed)
   Subjective:    Patient ID: Cindy Martinez, female    DOB: [birth year], 24 y.o.   MRN: [id number]  HPI Patient seen for abnormal bleeding after a LEEP two weeks ago. Had a normal period last week, then tapered to spotting. Had heavy bleeding last night and called the after hours service. Came in today. Bleeding today is improved, but still having light flow. Denies sexual activity, wearing tampons.   Also c/o fishy odor. Has recurrent BV infections.    Review of Systems     Objective:   Physical Exam Vitals reviewed. Exam conducted with a chaperone present.  HENT:     Head: Normocephalic and atraumatic.  Abdominal:     Hernia: There is no hernia in the left inguinal area or right inguinal area.  Genitourinary:    Labia:        Right: No rash, tenderness, lesion or injury.        Left: No rash, tenderness, lesion or injury.      Urethra: No prolapse, urethral pain, urethral swelling or urethral lesion.     Lymphadenopathy:     Lower Body: No right inguinal adenopathy. No left inguinal adenopathy.  Skin:    Capillary Refill: Capillary refill takes less than 2 seconds.  Neurological:     Mental Status: She is alert.  Psychiatric:        Mood and Affect: Mood normal.        Behavior: Behavior normal.        Thought Content: Thought content normal.        Judgment: Judgment normal.         Assessment & Plan:  1. Abnormal uterine bleeding (AUB) Cervix appears to be healing well. Bleeding intrauterine - AUB? Will monitor for now.   2. Vaginal discharge - Cervicovaginal ancillary only( [place])

## 2019-10-28 ENCOUNTER — Telehealth: Payer: Self-pay

## 2019-10-28 DIAGNOSIS — N760 Acute vaginitis: Secondary | ICD-10-CM

## 2019-10-28 DIAGNOSIS — B9689 Other specified bacterial agents as the cause of diseases classified elsewhere: Secondary | ICD-10-CM

## 2019-10-28 LAB — CERVICOVAGINAL ANCILLARY ONLY
Bacterial Vaginitis (gardnerella): POSITIVE — AB
Candida Glabrata: NEGATIVE
Candida Vaginitis: NEGATIVE
Chlamydia: NEGATIVE
Comment: NEGATIVE
Comment: NEGATIVE
Comment: NEGATIVE
Comment: NEGATIVE
Comment: NEGATIVE
Comment: NORMAL
Neisseria Gonorrhea: NEGATIVE
Trichomonas: NEGATIVE

## 2019-10-28 MED ORDER — METRONIDAZOLE 500 MG PO TABS: 500.0000 mg | ORAL_TABLET | Freq: Two times a day (BID) | ORAL | 0 refills | Status: DC

## 2019-10-28 NOTE — Telephone Encounter (Signed)
Pt called the office regarding positive BV results. Pt made aware that Flagyl will be sent to her pharmacy. Advised pt to take medication with food and to not drink alcohol as it can make her sick.  Understanding was voiced.  Cindy Martinez l Cindy Martinez, CMA

## 2019-11-12 ENCOUNTER — Ambulatory Visit (INDEPENDENT_AMBULATORY_CARE_PROVIDER_SITE_OTHER): Payer: Medicaid Other | Admitting: Obstetrics & Gynecology

## 2019-11-12 ENCOUNTER — Other Ambulatory Visit: Payer: Self-pay

## 2019-11-12 ENCOUNTER — Encounter: Payer: Self-pay | Admitting: Obstetrics & Gynecology

## 2019-11-12 VITALS — BP 124/70 | HR 98 | Ht 63.0 in | Wt 190.1 lb

## 2019-11-12 DIAGNOSIS — N920 Excessive and frequent menstruation with regular cycle: Secondary | ICD-10-CM

## 2019-11-12 DIAGNOSIS — N871 Moderate cervical dysplasia: Secondary | ICD-10-CM

## 2019-11-12 DIAGNOSIS — N946 Dysmenorrhea, unspecified: Secondary | ICD-10-CM

## 2019-11-12 MED ORDER — NORETHIN ACE-ETH ESTRAD-FE 1-20 MG-MCG(24) PO TABS: 1.0000 | ORAL_TABLET | Freq: Every day | ORAL | 11 refills | Status: DC

## 2019-11-12 NOTE — Progress Notes (Signed)
24 y.o. Cindy Martinez here today for post LEEP check . Pt denies problesm. She is not currently sexually active. She denies bleeding, pain or abnormal discharge.   Her fallopian tubes have been removed. Pt reports heavy cycles that are painful. The NSAIDs alone are not helpful.     The following portions of the patient's history were reviewed and updated as appropriate: allergies, current medications, past family history, past medical history, past social history, past surgical history and problem list.  Review of Systems:  Pertinent items are noted in HPI.    Objective:  Physical Exam BP 124/70   Pulse 98   Ht 5\' 3"  (1.6 m)   Wt 190 lb 1.3 oz (86.2 kg)   LMP 11/10/2019   BMI 33.67 kg/m  Patient's last menstrual period was 11/10/2019.  CONSTITUTIONAL: Well-developed, well-nourished female in no acute distress.  HENT:  Normocephalic, atraumatic EYES: Conjunctivae and EOM are normal. No scleral icterus.  NECK: Normal range of motion SKIN: Skin is warm and dry. No rash noted. Not diaphoretic.No pallor. NEUROLGIC: Alert and oriented to person, place, and time. Normal coordination.  Pelvic: Normal appearing external genitalia; normal appearing vaginal mucosa and cervix.  Normal discharge.  surgical site healing well.   Labs and Imaging Surg path: 10/07/2019 FINAL MICROSCOPIC DIAGNOSIS:   A. CERVIX, LEEP:  - High-grade squamous intraepithelial lesion, CIN-2.  - Margins not involved.   Assessment & Plan:  4 week post LEEP check  Doing well  surg path reviewed.    F/u in 1 year for repeat PAP or sooner prn  Menorrhagia with cycles.   Pt wants regular cycles. Was on OCPs prev. Would like to restart. Her fallopian tubes have been removed.  Loestrin 1 po qday  Keep Naproxen prn   F/u in 3 months     Estefania Kamiya L. Harraway-Smith, M.D., 12/05/2019

## 2019-11-12 NOTE — Patient Instructions (Signed)

## 2019-11-19 ENCOUNTER — Other Ambulatory Visit: Payer: Self-pay

## 2019-11-19 DIAGNOSIS — B9689 Other specified bacterial agents as the cause of diseases classified elsewhere: Secondary | ICD-10-CM

## 2019-11-19 DIAGNOSIS — N760 Acute vaginitis: Secondary | ICD-10-CM

## 2019-11-19 MED ORDER — METRONIDAZOLE 500 MG PO TABS: 500.0000 mg | ORAL_TABLET | Freq: Two times a day (BID) | ORAL | 0 refills | Status: DC

## 2020-01-30 ENCOUNTER — Emergency Department (HOSPITAL_BASED_OUTPATIENT_CLINIC_OR_DEPARTMENT_OTHER): Payer: Medicaid Other

## 2020-01-30 ENCOUNTER — Other Ambulatory Visit: Payer: Self-pay

## 2020-01-30 ENCOUNTER — Emergency Department (HOSPITAL_BASED_OUTPATIENT_CLINIC_OR_DEPARTMENT_OTHER)
Admission: EM | Admit: 2020-01-30 | Discharge: 2020-01-30 | Disposition: A | Discharge status: Home / Self Care | Payer: Medicaid Other | Attending: Emergency Medicine | Admitting: Emergency Medicine

## 2020-01-30 ENCOUNTER — Encounter (HOSPITAL_BASED_OUTPATIENT_CLINIC_OR_DEPARTMENT_OTHER): Payer: Self-pay

## 2020-01-30 DIAGNOSIS — R109 Unspecified abdominal pain: Secondary | ICD-10-CM | POA: Diagnosis present

## 2020-01-30 DIAGNOSIS — R102 Pelvic and perineal pain: Secondary | ICD-10-CM | POA: Diagnosis not present

## 2020-01-30 DIAGNOSIS — R1031 Right lower quadrant pain: Secondary | ICD-10-CM | POA: Insufficient documentation

## 2020-01-30 LAB — CBC
HCT: 42.2 % (ref 36.0–46.0)
Hemoglobin: 14.5 g/dL (ref 12.0–15.0)
MCH: 28.9 pg (ref 26.0–34.0)
MCHC: 34.4 g/dL (ref 30.0–36.0)
MCV: 84.2 fL (ref 80.0–100.0)
Platelets: 392 10*3/uL (ref 150–400)
RBC: 5.01 MIL/uL (ref 3.87–5.11)
RDW: 12.7 % (ref 11.5–15.5)
WBC: 6.9 10*3/uL (ref 4.0–10.5)
nRBC: 0 % (ref 0.0–0.2)

## 2020-01-30 LAB — COMPREHENSIVE METABOLIC PANEL
ALT: 25 U/L (ref 0–44)
AST: 20 U/L (ref 15–41)
Albumin: 4.4 g/dL (ref 3.5–5.0)
Alkaline Phosphatase: 69 U/L (ref 38–126)
Anion gap: 11 (ref 5–15)
BUN: 10 mg/dL (ref 6–20)
CO2: 25 mmol/L (ref 22–32)
Calcium: 8.9 mg/dL (ref 8.9–10.3)
Chloride: 102 mmol/L (ref 98–111)
Creatinine, Ser: 0.75 mg/dL (ref 0.44–1.00)
GFR calc Af Amer: >60 mL/min (ref >60)
GFR calc non Af Amer: >60 mL/min (ref >60)
Glucose, Bld: 145 mg/dL — ABNORMAL HIGH (ref 70–99)
Potassium: 3.3 mmol/L — ABNORMAL LOW (ref 3.5–5.1)
Sodium: 138 mmol/L (ref 135–145)
Total Bilirubin: 1 mg/dL (ref 0.3–1.2)
Total Protein: 7.5 g/dL (ref 6.5–8.1)

## 2020-01-30 LAB — URINALYSIS, ROUTINE W REFLEX MICROSCOPIC
Bilirubin Urine: NEGATIVE
Glucose, UA: 100 mg/dL — AB
Hgb urine dipstick: NEGATIVE
Ketones, ur: NEGATIVE mg/dL
Leukocytes,Ua: NEGATIVE
Nitrite: NEGATIVE
Protein, ur: NEGATIVE mg/dL
Specific Gravity, Urine: 1.02 (ref 1.005–1.030)
pH: 6 (ref 5.0–8.0)

## 2020-01-30 LAB — WET PREP, GENITAL
Clue Cells Wet Prep HPF POC: NONE SEEN
Sperm: NONE SEEN
Trich, Wet Prep: NONE SEEN
Yeast Wet Prep HPF POC: NONE SEEN

## 2020-01-30 LAB — LIPASE, BLOOD: Lipase: 22 U/L (ref 11–51)

## 2020-01-30 LAB — PREGNANCY, URINE: Preg Test, Ur: NEGATIVE

## 2020-01-30 MED ORDER — SODIUM CHLORIDE 0.9% FLUSH
3.0000 mL | Freq: Once | INTRAVENOUS | Status: DC
  Filled 2020-01-30: qty 3

## 2020-01-30 MED ORDER — MORPHINE SULFATE (PF) 4 MG/ML IV SOLN
4.0000 mg | Freq: Once | INTRAVENOUS | Status: DC
  Filled 2020-01-30: qty 1

## 2020-01-30 MED ORDER — POTASSIUM CHLORIDE CRYS ER 20 MEQ PO TBCR
40.0000 meq | EXTENDED_RELEASE_TABLET | Freq: Once | ORAL | Status: AC
  Administered 2020-01-30: 40 meq via ORAL
  Filled 2020-01-30: qty 2

## 2020-01-30 MED ORDER — ONDANSETRON HCL 4 MG/2ML IJ SOLN
4.0000 mg | Freq: Once | INTRAMUSCULAR | Status: DC
  Filled 2020-01-30: qty 2

## 2020-01-30 MED ORDER — SODIUM CHLORIDE 0.9 % IV BOLUS
1000.0000 mL | Freq: Once | INTRAVENOUS | Status: AC
  Administered 2020-01-30: 1000 mL via INTRAVENOUS

## 2020-01-30 MED ORDER — IOHEXOL 300 MG/ML  SOLN
100.0000 mL | Freq: Once | INTRAMUSCULAR | Status: AC | PRN
  Administered 2020-01-30: 100 mL via INTRAVENOUS

## 2020-01-30 MED ORDER — KETOROLAC TROMETHAMINE 30 MG/ML IJ SOLN
30.0000 mg | Freq: Once | INTRAMUSCULAR | Status: AC
  Administered 2020-01-30: 30 mg via INTRAVENOUS
  Filled 2020-01-30: qty 1

## 2020-01-30 MED ORDER — DIPHENHYDRAMINE HCL 50 MG/ML IJ SOLN
25.0000 mg | Freq: Once | INTRAMUSCULAR | Status: AC
  Administered 2020-01-30: 25 mg via INTRAVENOUS
  Filled 2020-01-30: qty 1

## 2020-01-30 NOTE — ED Notes (Signed)
ED Provider at bedside for pelvic exam.  

## 2020-01-30 NOTE — Discharge Instructions (Signed)
Your work-up today has been reassuring, your lab work looks good, pelvic ultrasound shows that your ovarian cyst has resolved, and your CT scan is also reassuring.  Scan did show a 5 mm stone in the left kidney, these do not usually cause pain until they start to pass.  Question whether pain could be related to endometriosis.  Please discuss with your OB/GYN.  Try taking naproxen twice daily regularly to see if this helps control your pain better and use Tylenol 1000 mg up to 4 times daily as needed for breakthrough pain.  Follow-up with PCP and OB/GYN.  Return to the ED for new or worsening symptoms.

## 2020-01-30 NOTE — ED Triage Notes (Signed)
Pt states that she has history of ovarian cyst reports that she did follow up and the cyst shrank some. Pt reports she has had increased pain starting last night, also reports pain with sex and some anxiety.

## 2020-01-30 NOTE — ED Provider Notes (Signed)
[facility] EMERGENCY DEPARTMENT Provider Note   CSN: [id number] Arrival date & time: 01/30/20  1315     History Chief Complaint  Patient presents with  . Abdominal Pain    Phoebe SharpsMariya Niehaus is a [age] y.o. female.  Phoebe SharpsMariya Chauca is a 24 y.o. female with a history of ovarian cyst, who presents to the ED for evaluation of continued right-sided lower abdominal and pelvic pain.  She reports that last night she started having increasing pain in this area and had some pain with intercourse.  She states that back in January she was diagnosed with an ovarian cyst on the right side, followed up with an OB/GYN a few months later who did a repeat ultrasound that showed that the cyst had shrunk some.  She was started on birth control to try and improve the constant cramping discomfort she was having.  Patient states that she took birth control for 2 months but continued to have constant pain and she felt that the birth control made it a bit worse so stopped taking it.  She reports that she also had a LEEP procedure in February and reported heavy bleeding afterwards.  She that birth control that she was taking did not really help with bleeding.  Currently she does not have bleeding outside of the timeframe of her usual menstrual cycle but reports periods are heavier than usual.  She was supposed to have a follow-up appointment in the last few months but states she was never called back by the office to schedule follow-up.  She is frustrated that she continues to have intermittent right-sided cramping pain that has been worsening recently.  She was prescribed naproxen which she reports did not help at all with the pain.  Has a history of kidney stones as well and reports pain intermittently radiates to her back but feels different than kidney stone she has had previously.  Denies any dysuria or urinary frequency.  No fevers or chills.  Denies any vaginal discharge.        Past Medical History:    Diagnosis Date  . Vaginal Pap smear, abnormal     There are no problems to display for this patient.   Past Surgical History:  Procedure Laterality Date  . LEEP N/A      OB History    Gravida  2   Para  2   Term  2   Preterm      AB      Living  1     SAB      TAB      Ectopic      Multiple      Live Births  1           No family history on file.  Social History   Tobacco Use  . Smoking status: Never Smoker  . Smokeless tobacco: Never Used  Substance Use Topics  . Alcohol use: Yes  . Drug use: No    Home Medications Prior to Admission medications   Medication Sig Start Date End Date Taking? Authorizing Provider  ibuprofen (ADVIL) 200 MG tablet Take 800 mg by mouth every 6 (six) hours as needed.   Yes [provider]  indapamide (LOZOL) 1.25 MG tablet Take 1.25 mg by mouth daily.     [provider]  metroNIDAZOLE (FLAGYL) 500 MG tablet Take 1 tablet (500 mg total) by mouth 2 (two) times daily. 11/19/19   Levie HeritageStinson, Jacob J, DO  Norethindrone Acetate-Ethinyl  Estrad-FE (LOESTRIN 24 FE) 1-20 MG-MCG(24) tablet Take 1 tablet by mouth daily. Patient taking differently: Take 1 tablet by mouth daily.  11/12/19   Willodean Rosenthal, MD  prochlorperazine (COMPAZINE) 10 MG tablet Take 1 tablet (10 mg total) by mouth every 6 (six) hours as needed for nausea or vomiting. Patient not taking: Reported on 10/23/2019 10/16/19   Dione Booze, MD  traMADol (ULTRAM) 50 MG tablet Take 1 tablet (50 mg total) by mouth every 6 (six) hours as needed. Patient not taking: Reported on 10/23/2019 07/25/19   Bethel Born, PA-C    Allergies    Omnipaque [iohexol]  Review of Systems   Review of Systems  Constitutional: Negative for chills and fever.  HENT: Negative.   Respiratory: Negative for cough and shortness of breath.   Cardiovascular: Negative for chest pain.  Gastrointestinal: Positive for abdominal pain. Negative for constipation, diarrhea,  nausea and vomiting.  Genitourinary: Positive for pelvic pain. Negative for dysuria, frequency, vaginal bleeding and vaginal discharge.  Musculoskeletal: Negative for arthralgias and myalgias.  Skin: Negative for color change and rash.  Neurological: Negative for dizziness, syncope and light-headedness.    Physical Exam Updated Vital Signs BP (!) 172/86 (BP Location: Right Arm)   Pulse (!) 119 Comment: pt reports being anxious  Temp 98.4 F (36.9 C) (Oral)   Resp 16   Ht 5\' 3"  (1.6 m)   Wt 84.4 kg   LMP 01/16/2020   SpO2 100%   BMI 32.95 kg/m   Physical Exam Vitals and nursing note reviewed.  Constitutional:      General: She is not in acute distress.    Appearance: She is well-developed. She is not ill-appearing or diaphoretic.     Comments: Well-appearing and in no distress  HENT:     Head: Normocephalic and atraumatic.  Eyes:     General:        Right eye: No discharge.        Left eye: No discharge.     Pupils: Pupils are equal, round, and reactive to light.  Cardiovascular:     Rate and Rhythm: Normal rate and regular rhythm.     Heart sounds: Normal heart sounds. No murmur. No friction rub. No gallop.   Pulmonary:     Effort: Pulmonary effort is normal. No respiratory distress.     Breath sounds: Normal breath sounds. No wheezing or rales.     Comments: Respirations equal and unlabored, patient able to speak in full sentences, lungs clear to auscultation bilaterally Abdominal:     General: Bowel sounds are normal. There is no distension.     Palpations: Abdomen is soft. There is no mass.     Tenderness: There is abdominal tenderness in the right lower quadrant. There is no guarding.     Comments: Abdomen is soft, nondistended, bowel sounds present throughout, patient localizes pain to the right lower quadrant, this is not significantly worsened with palpation patient has no guarding or peritoneal signs.  No CVA tenderness bilaterally  Genitourinary:    Comments:  Chaperone present during pelvic exam. No external genital lesions noted. Speculum exam with no vaginal discharge or bleeding noted, cervix appears normal. On bimanual exam patient does not have any cervical motion tenderness, no uterine or left adnexal tenderness but patient does have some discomfort with palpation over the right adnexa, no palpable masses. Musculoskeletal:        General: No deformity.     Cervical back: Neck supple.  Skin:  General: Skin is warm and dry.     Capillary Refill: Capillary refill takes less than 2 seconds.  Neurological:     Mental Status: She is alert.     Coordination: Coordination normal.     Comments: Speech is clear, able to follow commands Moves extremities without ataxia, coordination intact  Psychiatric:        Mood and Affect: Mood normal.        Behavior: Behavior normal.     ED Results / Procedures / Treatments   Labs (all labs ordered are listed, but only abnormal results are displayed) Labs Reviewed  WET PREP, GENITAL - Abnormal; Notable for the following components:      Result Value   WBC, Wet Prep HPF POC FEW (*)    All other components within normal limits  COMPREHENSIVE METABOLIC PANEL - Abnormal; Notable for the following components:   Potassium 3.3 (*)    Glucose, Bld 145 (*)    All other components within normal limits  URINALYSIS, ROUTINE W REFLEX MICROSCOPIC - Abnormal; Notable for the following components:   Glucose, UA 100 (*)    All other components within normal limits  LIPASE, BLOOD  CBC  PREGNANCY, URINE  GC/CHLAMYDIA PROBE AMP ([place]) NOT AT [facility]    EKG None  Radiology CT ABDOMEN PELVIS W CONTRAST  Result Date: 01/30/2020 CLINICAL DATA:  24 year old female with right lower quadrant abdominal pain. EXAM: CT ABDOMEN AND PELVIS WITH CONTRAST TECHNIQUE: Multidetector CT imaging of the abdomen and pelvis was performed using the standard protocol following bolus administration of intravenous contrast.  CONTRAST:  OMNIPAQUE IOHEXOL 300 MG/ML  SOLN COMPARISON:  Pelvic ultrasound dated 01/30/2020 and CT of the abdomen pelvis dated 07/25/2019. FINDINGS: Lower chest: The visualized lung bases are clear. No intra-abdominal free air or free fluid. Hepatobiliary: Probable mild fatty liver. No intrahepatic biliary dilatation. The gallbladder is unremarkable. Pancreas: Unremarkable. No pancreatic ductal dilatation or surrounding inflammatory changes. Spleen: Normal in size without focal abnormality. Adrenals/Urinary Tract: The adrenal glands are unremarkable. There is a 5 mm nonobstructing left renal inferior pole calculus. There is no hydronephrosis on either side. Subcentimeter right renal inferior pole hypodense lesion is too small to characterize. The visualized ureters and urinary bladder appear unremarkable. Stomach/Bowel: There is no bowel obstruction or active inflammation. The appendix is normal. Vascular/Lymphatic: The abdominal aorta and IVC are unremarkable. No portal venous gas. There is no adenopathy. Reproductive: The uterus is anteverted and grossly unremarkable. No adnexal masses. Other: None Musculoskeletal: No acute or significant osseous findings. IMPRESSION: 1. No acute intra-abdominal or pelvic pathology. No bowel obstruction. Normal appendix. 2. A 5 mm nonobstructing left renal inferior pole calculus. No hydronephrosis. Electronically Signed   By: Elgie Collard M.D.   On: 01/30/2020 16:16   US PELVIC COMPLETE W TRANSVAGINAL AND TORSION R/O  Result Date: 01/30/2020 CLINICAL DATA:  C/o RLQ cramping x 2 days. States this is a chronic issue for a long time and GYN is aware of it. Hx ovarian cyst, bilat salpingectomy, LEEP. EXAM: TRANSABDOMINAL AND TRANSVAGINAL ULTRASOUND OF PELVIS DOPPLER ULTRASOUND OF OVARIES TECHNIQUE: Both transabdominal and transvaginal ultrasound examinations of the pelvis were performed. Transabdominal technique was performed for global imaging of the pelvis including  uterus, ovaries, adnexal regions, and pelvic cul-de-sac. It was necessary to proceed with endovaginal exam following the transabdominal exam to visualize the bilateral ovaries. Color and duplex Doppler ultrasound was utilized to evaluate blood flow to the ovaries. COMPARISON:  Pelvic ultrasound 08/31/2019 FINDINGS:  Uterus Measurements: 9.2 x 4.4 x 5.9 cm = volume: 126 mL. No fibroids or other mass visualized. Endometrium Thickness: 0.6 cm.  No focal abnormality visualized. Right ovary Measurements: 4.0 x 2.4 x 2.2 cm = volume: 11 mL. Normal appearance/no adnexal mass. Left ovary Measurements: 3.4 x 2.2 x 2.3 cm = volume: 9 mL. Normal appearance/no adnexal mass. Pulsed Doppler evaluation of both ovaries demonstrates normal low-resistance arterial and venous waveforms. Other findings No abnormal free fluid. IMPRESSION: Normal sonographic appearance of the uterus and bilateral ovaries. Electronically Signed   By: Emmaline Kluver M.D.   On: 01/30/2020 15:03    Procedures Procedures (including critical care time)  Medications Ordered in ED Medications  sodium chloride flush (NS) 0.9 % injection 3 mL (3 mLs Intravenous Not Given 01/30/20 1422)  sodium chloride 0.9 % bolus 1,000 mL (1,000 mLs Intravenous New Bag/Given 01/30/20 1514)  potassium chloride SA (KLOR-CON) CR tablet 40 mEq (40 mEq Oral Given 01/30/20 1607)  iohexol (OMNIPAQUE) 300 MG/ML solution 100 mL (100 mLs Intravenous Contrast Given 01/30/20 1552)  diphenhydrAMINE (BENADRYL) injection 25 mg (25 mg Intravenous Given 01/30/20 1609)  ketorolac (TORADOL) 30 MG/ML injection 30 mg (30 mg Intravenous Given 01/30/20 1644)    ED Course  I have reviewed the triage vital signs and the nursing notes.  Pertinent labs & imaging results that were available during my care of the patient were reviewed by me and considered in my medical decision making (see chart for details).    MDM Rules/Calculators/A&P                     Patient presents to the ED with  complaints of abdominal pain. Patient nontoxic appearing, in no apparent distress, patient with some tachycardia and hypertension, does endorse feeling very anxious, will continue to monitor. On exam patient tender to palpation in the right lower quadrant, pain not significantly worsened with palpation, no peritoneal signs.  Some right adnexal tenderness on pelvic exam without palpable masses, no significant discharge or cervical motion tenderness on exam to suggest PID.  Will evaluate with labs and pelvic ultrasound.  Offered analgesics antiemetics, patient declines them at this time because she needs to drive home, fluids administered.   ER work-up reviewed:  CBC: No leukocytosis, normal hemoglobin CMP: Mild hypokalemia of 3.3, glucose 145, no other electrolyte derangements, normal renal and liver function Lipase: WNL UA: No signs of infection Preg test: Negative Wet prep: Few WBCs present, no other abnormalities, GC chlamydia pending  Imaging: Pelvic ultrasound: Resolution of previously noted right ovarian cyst and no other acute abnormalities noted, given normal ultrasound we will proceed with CT to assess for other potential etiologies of pain  CT abdomen pelvis with 5 mm left intrarenal stone, but no other abnormalities noted.  On repeat abdominal exam patient remains without peritoneal signs, doubt cholecystitis, pancreatitis, diverticulitis, appendicitis, bowel obstruction/perforation, PID, or ectopic pregnancy.  Given reassuring exam today will have patient follow-up with PCP and OB/GYN.  Question whether pain could be due to endometriosis potentially especially since it seems to be worse around her menstrual cycle and she has started to have some dyspareunia.  She has no vaginal discharge, minimal WBCs on wet prep and no cervical motion tenderness to suggest PID.  Patient tolerating PO in the emergency department. Will discharge home with supportive measures.  I have asked her to try taking  naproxen more regularly with Tylenol as needed for breakthrough pain to see if this helps better control her  pain.  I discussed results, treatment plan, need for PCP/OB-GYN follow-up, and return precautions with the patient. Provided opportunity for questions, patient confirmed understanding and is in agreement with plan.    Final Clinical Impression(s) / ED Diagnoses Final diagnoses:  Right lower quadrant abdominal pain  Pelvic pain    Rx / DC Orders ED Discharge Orders    None       Janet Berlin 01/30/20 1705    Charlesetta Shanks, MD 02/01/20 1028

## 2020-01-30 NOTE — ED Notes (Signed)
Pt in ultrasound

## 2020-02-02 LAB — GC/CHLAMYDIA PROBE AMP (~~LOC~~) NOT AT ARMC
Chlamydia: NEGATIVE
Comment: NEGATIVE
Comment: NORMAL
Neisseria Gonorrhea: NEGATIVE

## 2020-03-18 ENCOUNTER — Encounter (HOSPITAL_BASED_OUTPATIENT_CLINIC_OR_DEPARTMENT_OTHER): Payer: Self-pay | Admitting: Emergency Medicine

## 2020-03-18 ENCOUNTER — Other Ambulatory Visit: Payer: Self-pay

## 2020-03-18 DIAGNOSIS — L02415 Cutaneous abscess of right lower limb: Secondary | ICD-10-CM | POA: Diagnosis not present

## 2020-03-18 DIAGNOSIS — R2241 Localized swelling, mass and lump, right lower limb: Secondary | ICD-10-CM | POA: Diagnosis present

## 2020-03-18 NOTE — ED Triage Notes (Signed)
  Patient comes in with abscess from possible bug bite that she noticed yesterday afternoon.  Patient states she thought it was a mosquito bite but noticed today it had gotten more swollen and red. No fever at home or on arrival.  Bite is on R anterior thigh, red/hard and tender to touch.  Pain 1/10.

## 2020-03-19 ENCOUNTER — Emergency Department (HOSPITAL_BASED_OUTPATIENT_CLINIC_OR_DEPARTMENT_OTHER)
Admission: EM | Admit: 2020-03-19 | Discharge: 2020-03-19 | Disposition: A | Discharge status: Home / Self Care | Payer: Medicaid Other | Attending: Emergency Medicine | Admitting: Emergency Medicine

## 2020-03-19 DIAGNOSIS — L02415 Cutaneous abscess of right lower limb: Secondary | ICD-10-CM

## 2020-03-19 MED ORDER — DOXYCYCLINE HYCLATE 100 MG PO CAPS
100.0000 mg | ORAL_CAPSULE | Freq: Two times a day (BID) | ORAL | 0 refills | Status: DC
Start: 2020-03-19 — End: 2022-07-09

## 2020-03-19 MED ORDER — DOXYCYCLINE HYCLATE 100 MG PO TABS
100.0000 mg | ORAL_TABLET | Freq: Once | ORAL | Status: AC
  Administered 2020-03-19: 100 mg via ORAL
  Filled 2020-03-19: qty 1

## 2020-03-19 NOTE — ED Provider Notes (Signed)
[facility] Provider Note: Lowella Dell, MD, FACEP  CSN: [id number] MRN: [id number] ARRIVAL: 03/18/20 at 2204 ROOM: MH09/[facility]09   CHIEF COMPLAINT  Insect Bite   HISTORY OF PRESENT ILLNESS  03/19/20 2:50 AM Cindy Martinez is a 24 y.o. female who has a bump on her right anterior thigh which she believes is due to an insect bite.  She noticed this yesterday afternoon.  The lesion is firm, erythematous and tender to touch.  She rates her pain as a 1 out of 10, but worse with palpation.  She states it was negligible yesterday but has grown significantly since then.  She denies any systemic symptoms such as fever or chills.  She did not actually see an insect on her leg.   Past Medical History:  Diagnosis Date   Vaginal Pap smear, abnormal     Past Surgical History:  Procedure Laterality Date   LEEP N/A     History reviewed. No pertinent family history.  Social History   Tobacco Use   Smoking status: Never Smoker   Smokeless tobacco: Never Used  Substance Use Topics   Alcohol use: Yes   Drug use: No    Prior to Admission medications   Medication Sig Start Date End Date Taking? Authorizing Provider  indapamide (LOZOL) 1.25 MG tablet Take 1.25 mg by mouth daily.     [provider]  Norethindrone Acetate-Ethinyl Estrad-FE (LOESTRIN 24 FE) 1-20 MG-MCG(24) tablet Take 1 tablet by mouth daily. Patient taking differently: Take 1 tablet by mouth daily.  11/12/19   Willodean Rosenthal, MD  prochlorperazine (COMPAZINE) 10 MG tablet Take 1 tablet (10 mg total) by mouth every 6 (six) hours as needed for nausea or vomiting. Patient not taking: Reported on 10/23/2019 10/16/19 03/19/20  Dione Booze, MD    Allergies Omnipaque [iohexol]   REVIEW OF SYSTEMS  Negative except as noted here or in the History of Present Illness.   PHYSICAL EXAMINATION  Initial Vital Signs Blood pressure (!) 151/112, pulse 83, temperature 98.4 F (36.9 C), temperature source  Oral, resp. rate 20, height 5\' 3"  (1.6 m), weight 86.2 kg, last menstrual period 03/18/2020, SpO2 99 %.  Examination General: Well-developed, well-nourished female in no acute distress; appearance consistent with age of record HENT: normocephalic; atraumatic Eyes: pupils equal, round and reactive to light; extraocular muscles intact Neck: supple Heart: regular rate and rhythm Lungs: clear to auscultation bilaterally Abdomen: soft; nondistended; nontender; bowel sounds present Extremities: No deformity; full range of motion; pulses normal Neurologic: Awake, alert and oriented; motor function intact in all extremities and symmetric; no facial droop Skin: Warm and dry; tender, erythematous lesion right thigh with central thickening which could represent early eschar:    Psychiatric: Normal mood and affect   RESULTS  Summary of this visit's results, reviewed and interpreted by myself:   EKG Interpretation  Date/Time:    Ventricular Rate:    PR Interval:    QRS Duration:   QT Interval:    QTC Calculation:   R Axis:     Text Interpretation:        Laboratory Studies: No results found for this or any previous visit (from the past 24 hour(s)). Imaging Studies: No results found.  ED COURSE and MDM  Nursing notes, initial and subsequent vitals signs, including pulse oximetry, reviewed and interpreted by myself.  Vitals:   03/18/20 2222 03/18/20 2223 03/19/20 0112  BP: (!) 141/97  (!) 151/112  Pulse: 85  83  Resp: 18  20  Temp: 98.4 F (36.9 C)    TempSrc: Oral    SpO2: 99%  99%  Weight:  86.2 kg   Height:  5\' 3"  (1.6 m)    Medications  doxycycline (VIBRA-TABS) tablet 100 mg (has no administration in time range)    The patient's lesion could be an insect or arthropod bite but has the appearance of an early MRSA abscess.  There is no fluctuance at this time and we will treat with doxycycline.  PROCEDURES  Procedures   ED DIAGNOSES     ICD-10-CM   1. Abscess of  right thigh  L02.415        Jalyric Kaestner, , MD 03/19/20 03/21/20

## 2020-03-30 ENCOUNTER — Other Ambulatory Visit: Payer: Self-pay

## 2020-03-30 MED ORDER — METRONIDAZOLE 500 MG PO TABS
500.0000 mg | ORAL_TABLET | Freq: Two times a day (BID) | ORAL | 0 refills | Status: DC
Start: 2020-03-30 — End: 2022-07-09

## 2020-06-30 ENCOUNTER — Other Ambulatory Visit (HOSPITAL_COMMUNITY)
Admission: RE | Admit: 2020-06-30 | Discharge: 2020-06-30 | Disposition: A | Discharge status: Home / Self Care | Payer: Medicaid Other | Source: Ambulatory Visit | Attending: Family Medicine | Admitting: Family Medicine

## 2020-06-30 ENCOUNTER — Other Ambulatory Visit (INDEPENDENT_AMBULATORY_CARE_PROVIDER_SITE_OTHER): Payer: Medicaid Other

## 2020-06-30 ENCOUNTER — Other Ambulatory Visit: Payer: Self-pay

## 2020-06-30 VITALS — BP 145/80 | HR 113 | Ht 63.0 in | Wt 205.1 lb

## 2020-06-30 DIAGNOSIS — Z113 Encounter for screening for infections with a predominantly sexual mode of transmission: Secondary | ICD-10-CM | POA: Diagnosis not present

## 2020-06-30 NOTE — Progress Notes (Signed)
Chart reviewed - agree with CMA/RN documentation.  ° °

## 2020-06-30 NOTE — Progress Notes (Signed)
SUBJECTIVE:  24 y.o. female complains of vaginal itching x3 days. Denies abnormal vaginal bleeding or significant pelvic pain or fever. No UTI symptoms. Denies history of known exposure to STD.  Patient's last menstrual period was 05/22/2020 (exact date).  OBJECTIVE:  She appears well, afebrile. Urine dipstick: Not done  ASSESSMENT:  Vaginal Itching   PLAN:  GC, chlamydia, trichomonas, BVAG, CVAG probe sent to lab. Treatment: To be determined once lab results are received ROV prn if symptoms persist or worsen.  *Pt also requested STI labs. Pt was seen at PCP yesterday but is unsure of what labs were drawn.

## 2020-07-01 LAB — CERVICOVAGINAL ANCILLARY ONLY
Bacterial Vaginitis (gardnerella): NEGATIVE
Candida Glabrata: NEGATIVE
Candida Vaginitis: POSITIVE — AB
Chlamydia: NEGATIVE
Comment: NEGATIVE
Comment: NEGATIVE
Comment: NEGATIVE
Comment: NEGATIVE
Comment: NEGATIVE
Comment: NORMAL
Neisseria Gonorrhea: NEGATIVE
Trichomonas: NEGATIVE

## 2020-07-01 LAB — RPR: RPR Ser Ql: NONREACTIVE

## 2020-07-01 LAB — HEPATITIS B SURFACE ANTIGEN: Hepatitis B Surface Ag: NEGATIVE

## 2020-07-01 LAB — HEPATITIS C ANTIBODY: Hep C Virus Ab: 0.2 s/co ratio (ref 0.0–0.9)

## 2020-07-01 LAB — HIV ANTIBODY (ROUTINE TESTING W REFLEX): HIV Screen 4th Generation wRfx: NONREACTIVE

## 2020-07-01 MED ORDER — FLUCONAZOLE 150 MG PO TABS
150.0000 mg | ORAL_TABLET | Freq: Once | ORAL | 3 refills | Status: AC
Start: 2020-07-01 — End: 2020-07-01

## 2020-07-01 NOTE — Addendum Note (Signed)
Addended by: Levie Heritage on: 07/01/2020 03:28 PM   Modules accepted: Orders

## 2020-07-18 ENCOUNTER — Other Ambulatory Visit: Payer: Self-pay

## 2020-07-18 MED ORDER — FLUCONAZOLE 150 MG PO TABS
ORAL_TABLET | ORAL | 1 refills | Status: DC
Start: 2020-07-18 — End: 2021-01-31

## 2020-08-08 ENCOUNTER — Other Ambulatory Visit (HOSPITAL_COMMUNITY)
Admission: RE | Admit: 2020-08-08 | Discharge: 2020-08-08 | Disposition: A | Discharge status: Home / Self Care | Payer: Medicaid Other | Source: Ambulatory Visit | Attending: Obstetrics & Gynecology | Admitting: Obstetrics & Gynecology

## 2020-08-08 ENCOUNTER — Ambulatory Visit (INDEPENDENT_AMBULATORY_CARE_PROVIDER_SITE_OTHER): Payer: Medicaid Other | Admitting: Obstetrics & Gynecology

## 2020-08-08 ENCOUNTER — Encounter: Payer: Self-pay | Admitting: Obstetrics & Gynecology

## 2020-08-08 ENCOUNTER — Other Ambulatory Visit: Payer: Self-pay

## 2020-08-08 VITALS — BP 126/84 | HR 91 | Wt 203.0 lb

## 2020-08-08 DIAGNOSIS — Z01419 Encounter for gynecological examination (general) (routine) without abnormal findings: Secondary | ICD-10-CM | POA: Insufficient documentation

## 2020-08-08 DIAGNOSIS — R102 Pelvic and perineal pain: Secondary | ICD-10-CM | POA: Diagnosis not present

## 2020-08-08 MED ORDER — DICLOFENAC POTASSIUM 50 MG PO TABS: 50.0000 mg | ORAL_TABLET | Freq: Three times a day (TID) | ORAL | 2 refills | Status: DC

## 2020-08-08 NOTE — Progress Notes (Signed)
Subjective:     Cindy Martinez is a 24 y.o. female here for a routine exam.  Current complaints: pt reports pelvic pain. She takes Motrin and naprosyn and Tylenol with no relief. The pain is intermittent and resolves spon.   She has had a bilateral salpintectomy, Pt took OCPs to reg her cycle x 1 month but stopped after 1 month.     Gynecologic History Patient's last menstrual period was 07/05/2020 (exact date). 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  Review of Systems Pertinent items are noted in HPI.    Objective:  BP 126/84   Pulse 91   Wt 203 lb (92.1 kg)   LMP 07/05/2020 (Exact Date)   BMI 35.96 kg/m  General Appearance:    Alert, cooperative, no distress, appears stated age  Head:    Normocephalic, without obvious abnormality, atraumatic  Eyes:    conjunctiva/corneas clear, EOM's intact, both eyes  Ears:    Normal external ear canals, both ears  Nose:   Nares normal, septum midline, mucosa normal, no drainage    or sinus tenderness  Throat:   Lips, mucosa, and tongue normal; teeth and gums normal  Neck:   Supple, symmetrical, trachea midline, no adenopathy;    thyroid:  no enlargement/tenderness/nodules  Back:     Symmetric, no curvature, ROM normal, no CVA tenderness  Lungs:     respirations unlabored  Chest Wall:    No tenderness or deformity   Heart:    Regular rate and rhythm  Breast Exam:    No tenderness, masses, or  nipple abnormality  Abdomen:     Soft, non-tender, bowel sounds active all four quadrants,    no masses, no organomegaly  Genitalia:    Normal female without lesion, discharge or tenderness     Extremities:   Extremities normal, atraumatic, no cyanosis or edema  Pulses:   2+ and symmetric all extremities  Skin:   Skin color, texture, turgor normal, no rashes or lesions  01/30/2020 CLINICAL DATA:  C/o RLQ cramping x 2 days. States this is a chronic issue for a long time and GYN is aware of it. Hx ovarian cyst, bilat salpingectomy, LEEP.  EXAM: TRANSABDOMINAL AND TRANSVAGINAL ULTRASOUND OF PELVIS  DOPPLER ULTRASOUND OF OVARIES  TECHNIQUE: Both transabdominal and transvaginal ultrasound examinations of the pelvis were performed. Transabdominal technique was performed for global imaging of the pelvis including uterus, ovaries, adnexal regions, and pelvic cul-de-sac.  It was necessary to proceed with endovaginal exam following the transabdominal exam to visualize the bilateral ovaries. Color and duplex Doppler ultrasound was utilized to evaluate blood flow to the ovaries.  COMPARISON:  Pelvic ultrasound 08/31/2019  FINDINGS: Uterus  Measurements: 9.2 x 4.4 x 5.9 cm = volume: 126 mL. No fibroids or other mass visualized.  Endometrium  Thickness: 0.6 cm.  No  focal abnormality visualized.  Right ovary  Measurements: 4.0 x 2.4 x 2.2 cm = volume: 11 mL. Normal appearance/no adnexal mass.  Left ovary  Measurements: 3.4 x 2.2 x 2.3 cm = volume: 9 mL. Normal appearance/no adnexal mass.  Pulsed Doppler evaluation of both ovaries demonstrates normal low-resistance arterial and venous waveforms.  Other findings  No abnormal free fluid.  IMPRESSION: Normal sonographic appearance of the uterus and bilateral ovaries.   Assessment:    Healthy female exam.   Chronic pelvic pain- the Ov cyst has resolved. Pt does not want to be on OCPs. She declines a LnIUD  at present. She is willing to try Cataflam.   H/o abnormal PAP and s/p LEEP    Plan:   Cindy Martinez was seen today for annual exam.  Diagnoses and all orders for this visit:  Well female exam with routine gynecological exam -     Cytology - PAP( [place])  Pelvic pain -     diclofenac (CATAFLAM) 50 MG tablet; Take 1 tablet (50 mg total) by mouth 3 (three) times daily.  Pt will call in 3 months or send me a note via MyChart to see how the meds are going. Would consier a GI consult for eval of IBS in the future.   Cindy Martinez, M.D., [place] [place]

## 2020-08-10 LAB — CYTOLOGY - PAP
Chlamydia: NEGATIVE
Comment: NEGATIVE
Comment: NORMAL
Diagnosis: NEGATIVE
Diagnosis: REACTIVE
Neisseria Gonorrhea: NEGATIVE

## 2020-09-03 DIAGNOSIS — Z419 Encounter for procedure for purposes other than remedying health state, unspecified: Secondary | ICD-10-CM | POA: Diagnosis not present

## 2020-10-04 DIAGNOSIS — Z419 Encounter for procedure for purposes other than remedying health state, unspecified: Secondary | ICD-10-CM | POA: Diagnosis not present

## 2020-10-20 ENCOUNTER — Other Ambulatory Visit: Payer: Self-pay | Admitting: Family Medicine

## 2020-11-01 DIAGNOSIS — Z419 Encounter for procedure for purposes other than remedying health state, unspecified: Secondary | ICD-10-CM | POA: Diagnosis not present

## 2020-11-27 IMAGING — CT CT RENAL STONE PROTOCOL
2 of 4 series · 16 of 46 positions shown, 18 images · non-contrast
Comparison: None.

CLINICAL DATA: Flank pain. Right-sided flank pain.

EXAM:
CT ABDOMEN AND PELVIS WITHOUT CONTRAST
TECHNIQUE: Multidetector CT imaging of the abdomen and pelvis was performed
following the standard protocol without IV contrast.

[Series 2: axial st · axial · 0.79mm/px · z∈[-521,-76]mm · 13 of 99 slices shown, 15 images]
[im 5/99  soft-tissue]
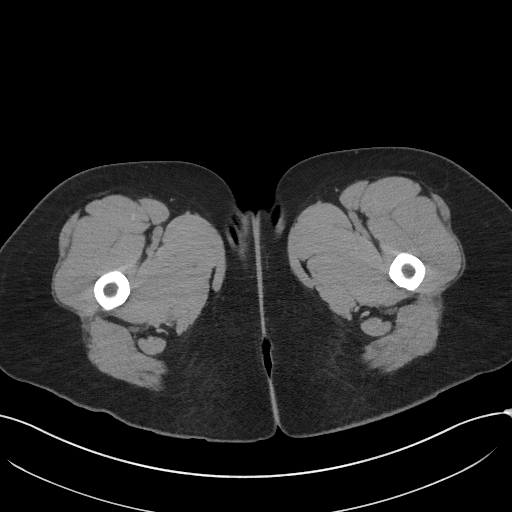
[im 5/99  bone]
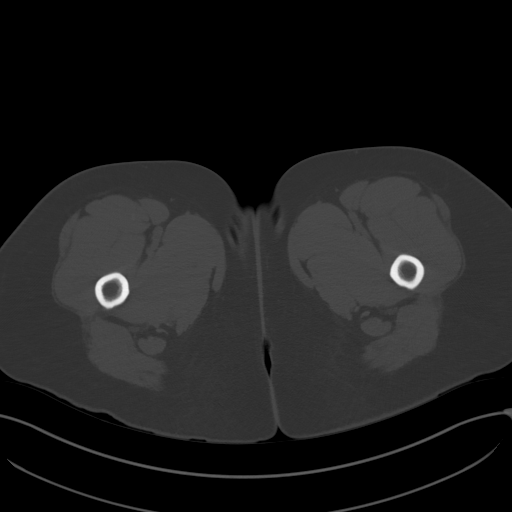
[im 13/99  soft-tissue]
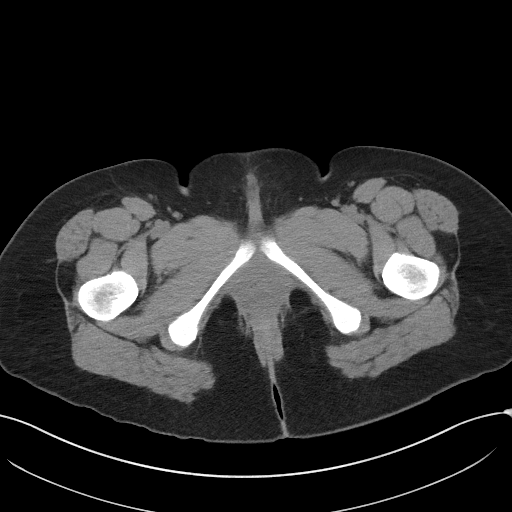
[im 21/99  soft-tissue]
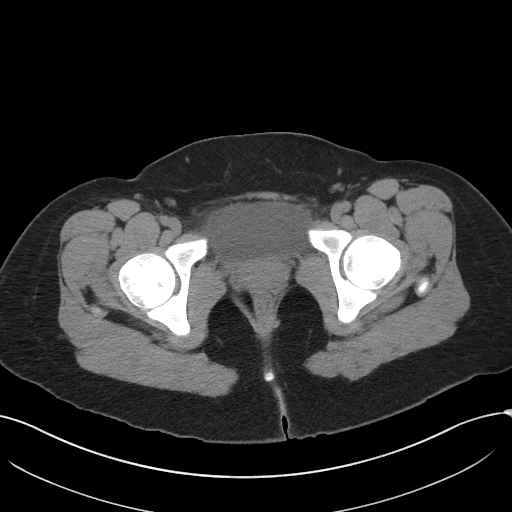
[im 29/99  soft-tissue]
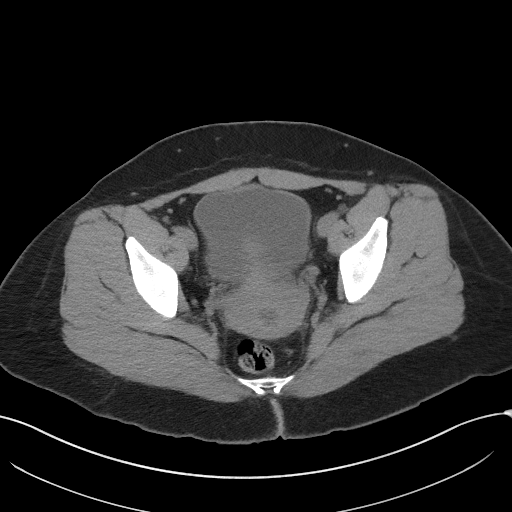
[im 33/99  soft-tissue]
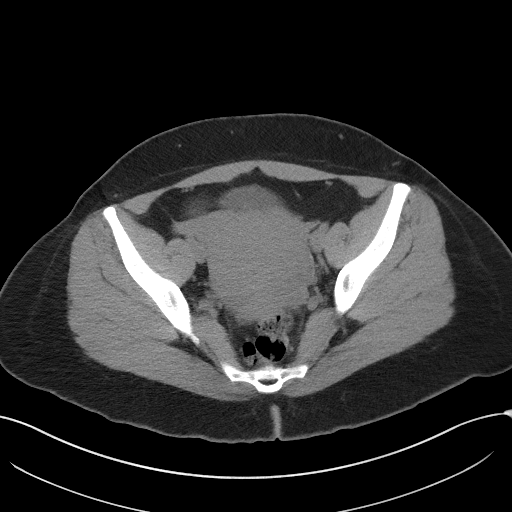
[im 41/99  soft-tissue]
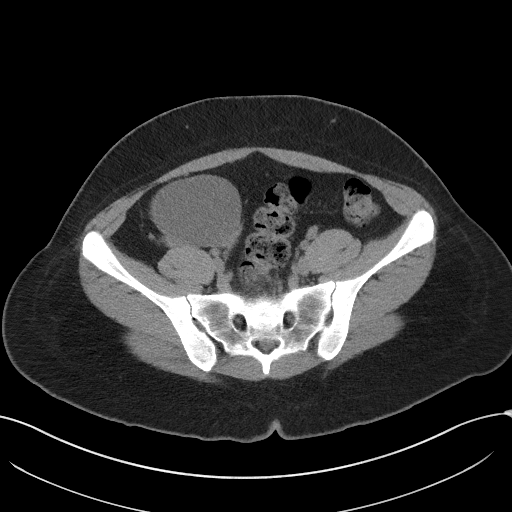
[im 50/99  soft-tissue]
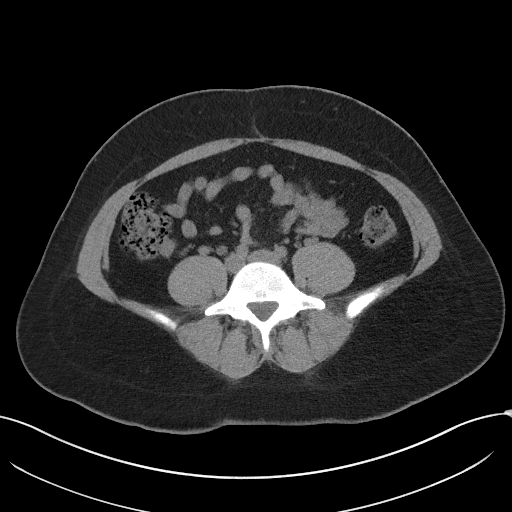
[im 58/99  soft-tissue]
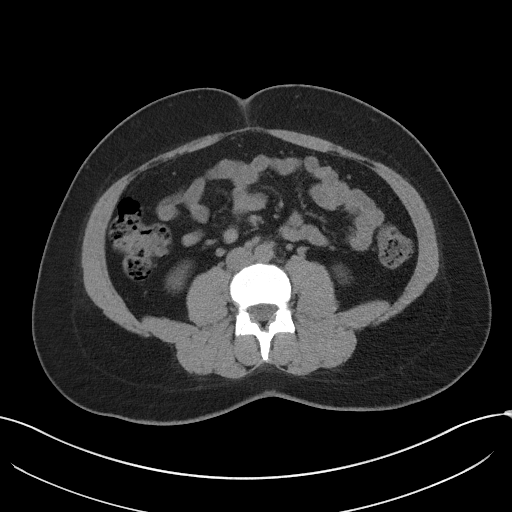
[im 66/99  soft-tissue]
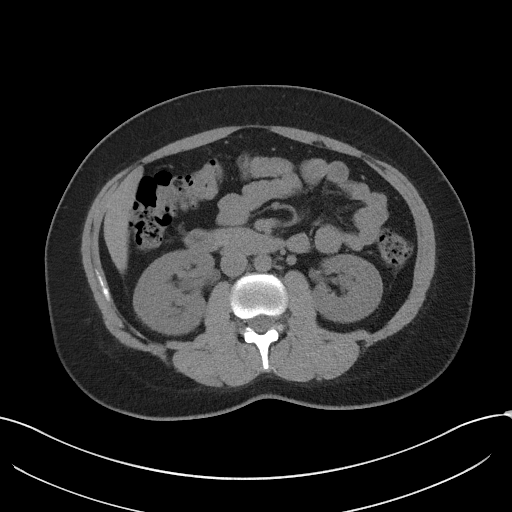
[im 66/99  bone]
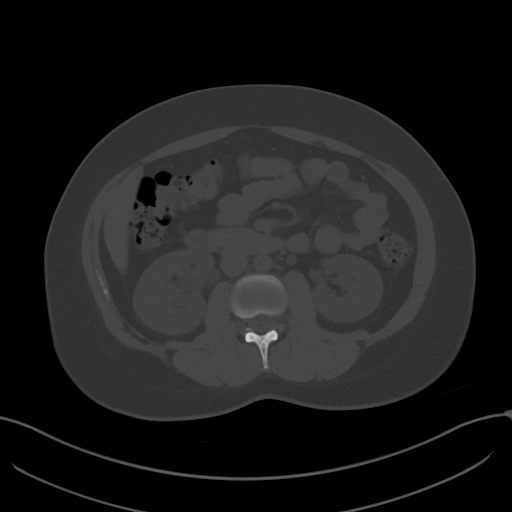
[im 70/99  soft-tissue]
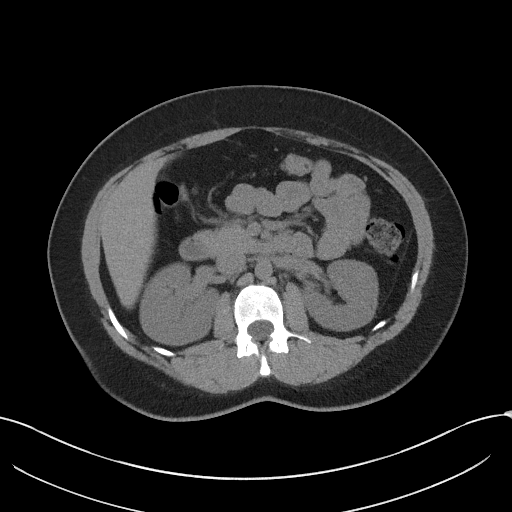
[im 78/99  soft-tissue]
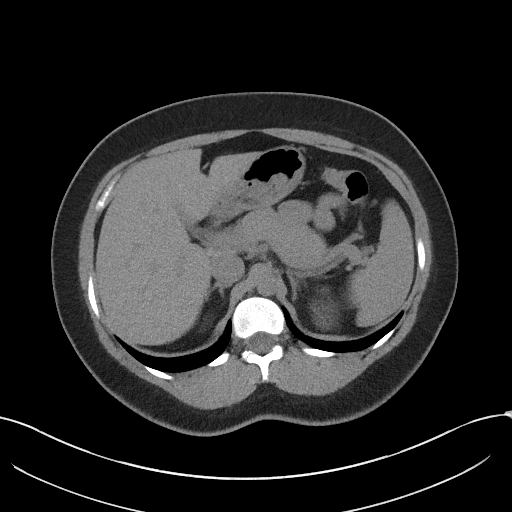
[im 86/99  soft-tissue]
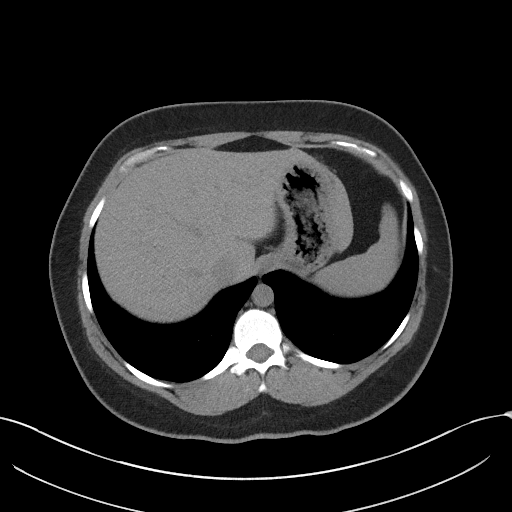
[im 94/99  soft-tissue]
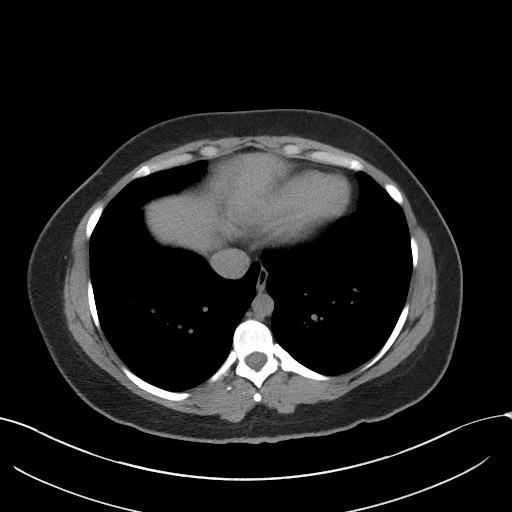

[Series 5: coronal st · coronal · 0.92mm/px · 3 of 101 slices shown]
[im 34/101  soft-tissue]
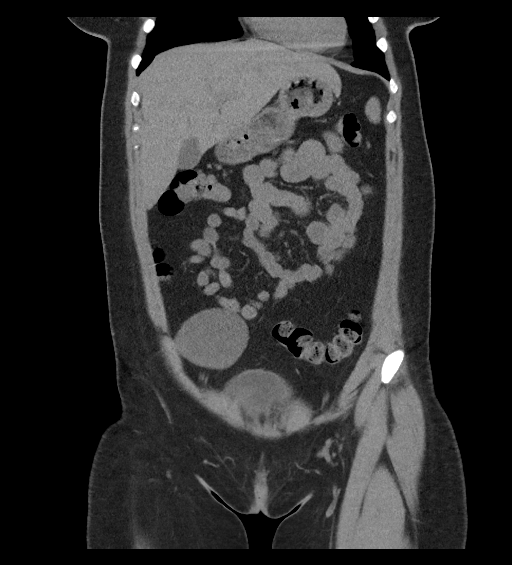
[im 45/101  soft-tissue]
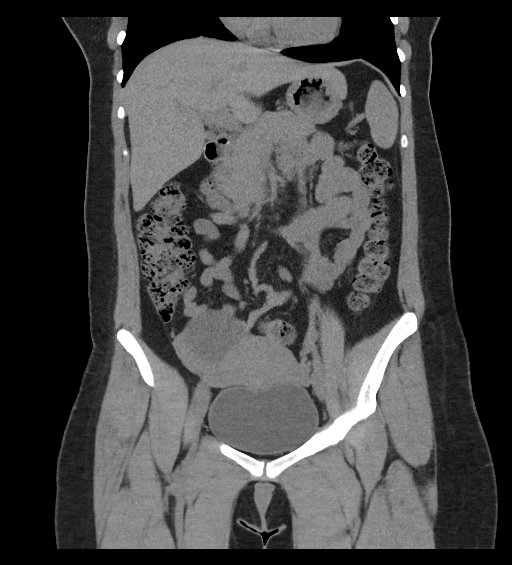
[im 56/101  soft-tissue]
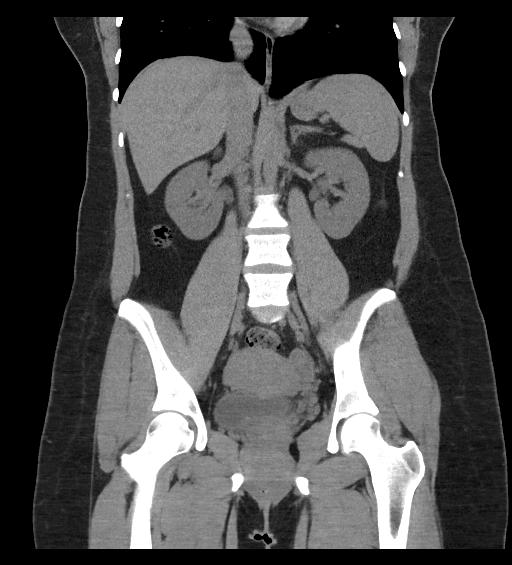

[16 of 46 positions shown; findings below may reference images not displayed]

FINDINGS: Lower chest: The lung bases are clear. The heart size is normal.

Hepatobiliary: The liver is normal. Normal gallbladder.There is no
biliary ductal dilation.

Pancreas: Normal contours without ductal dilatation. No
peripancreatic fluid collection.

Spleen: No splenic laceration or hematoma.

Adrenals/Urinary Tract:

--Adrenal glands: No adrenal hemorrhage.

--Right kidney/ureter: There are multiple nonobstructing stones
throughout the right kidney. There is mild right-sided collecting
system dilatation without evidence for an obstructing stone.

--Left kidney/ureter: There are nonobstructing stones in the lower
pole the left kidney.

--Urinary bladder: Unremarkable.

Stomach/Bowel:

--Stomach/Duodenum: No hiatal hernia or other gastric abnormality.
Normal duodenal course and caliber.

--Small bowel: No dilatation or inflammation.

--Colon: No focal abnormality.

--Appendix: Normal.

Vascular/Lymphatic: Normal course and caliber of the major abdominal
vessels.

--No retroperitoneal lymphadenopathy.

--No mesenteric lymphadenopathy.

--No pelvic or inguinal lymphadenopathy.

Reproductive: There is a right-sided ovarian cyst measuring
approximately 7.6 by 5.4 cm and 21 Hounsfield units. The left ovary
is unremarkable.

Other: No ascites or free air. The abdominal wall is normal.

Musculoskeletal. No acute displaced fractures.
IMPRESSION: 1. Right-sided ovarian cyst measuring up to 7.6 cm. Recommend
ultrasound follow-up in 6-8 weeks. If there is clinical concern for
an acute right lower quadrant process, follow-up with a more urgent
ultrasound is recommended.
2. Bilateral nonobstructive nephrolithiasis.
3. Mild right-sided collecting system dilatation without evidence
for an obstructing stone. Findings may represent sequela of a
recently passed stone.

## 2020-11-27 IMAGING — US US PELVIS COMPLETE TRANSABD/TRANSVAG W DUPLEX
1 series · 13 of 25 positions shown · non-contrast
Comparison: CT from same day

CLINICAL DATA: Pain with large right ovarian cyst.

EXAM:
TRANSABDOMINAL AND TRANSVAGINAL ULTRASOUND OF PELVIS
DOPPLER ULTRASOUND OF OVARIES
TECHNIQUE: Both transabdominal and transvaginal ultrasound examinations of the
pelvis were performed. Transabdominal technique was performed for
global imaging of the pelvis including uterus, ovaries, adnexal
regions, and pelvic cul-de-sac.
It was necessary to proceed with endovaginal exam following the
transabdominal exam to visualize the ovaries. Color and duplex
Doppler ultrasound was utilized to evaluate blood flow to the
ovaries.

[Series 1: us pelvis complete transabd/transvag w duplex · 13 of 73 slices shown]
[im 1/73]
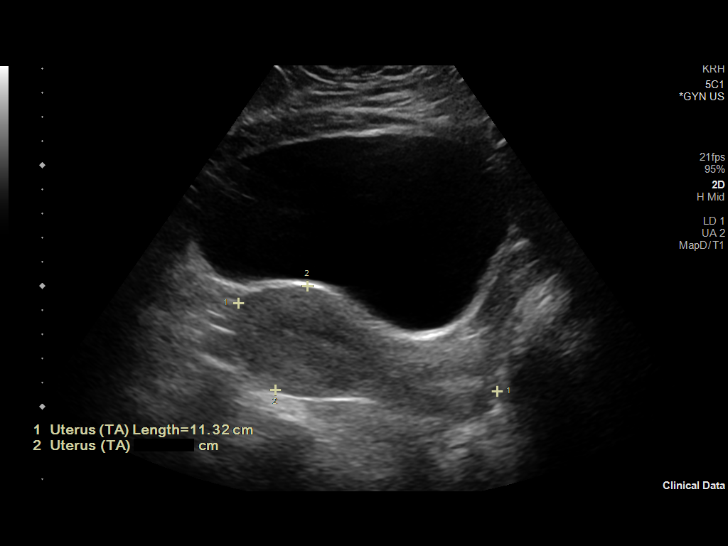
[im 7/73]
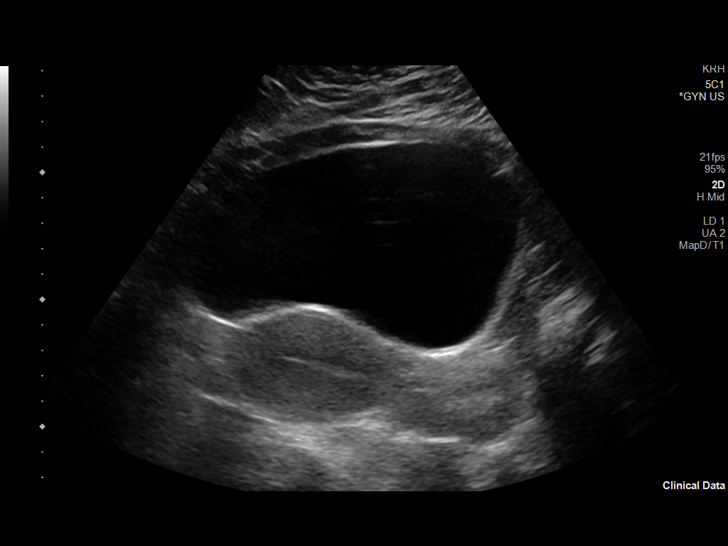
[im 13/73]
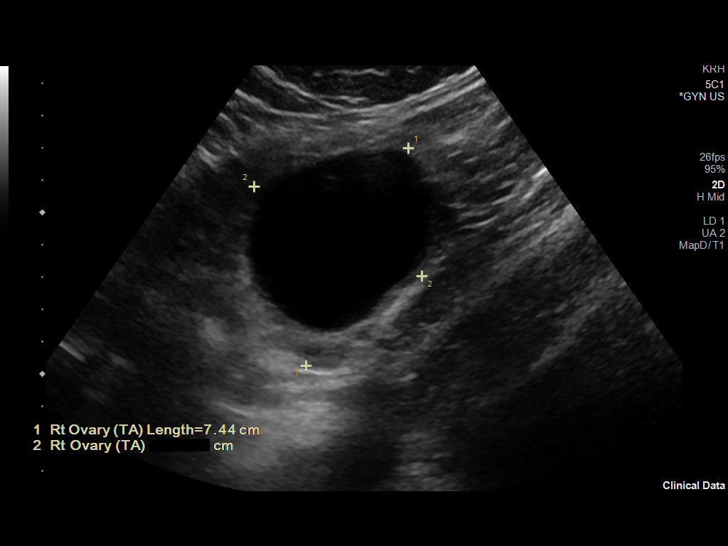
[im 19/73]
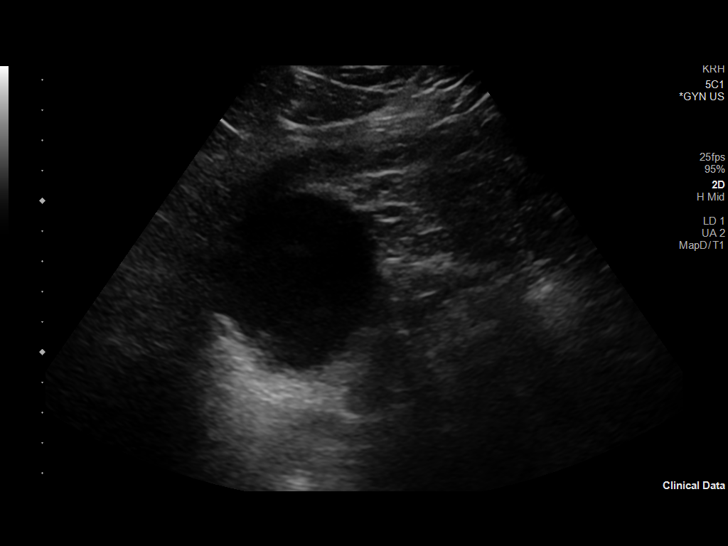
[im 25/73]
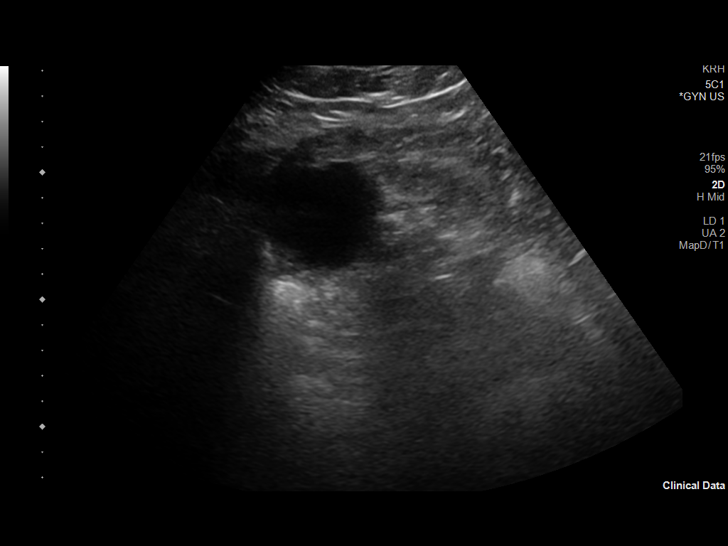
[im 31/73]
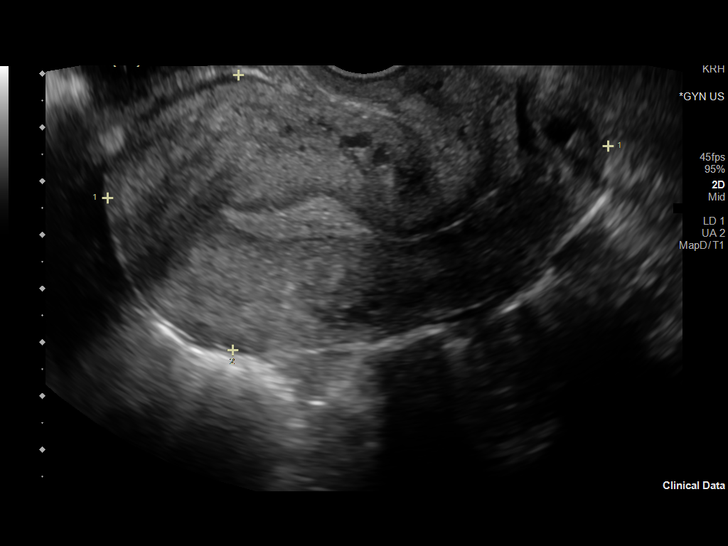
[im 37/73]
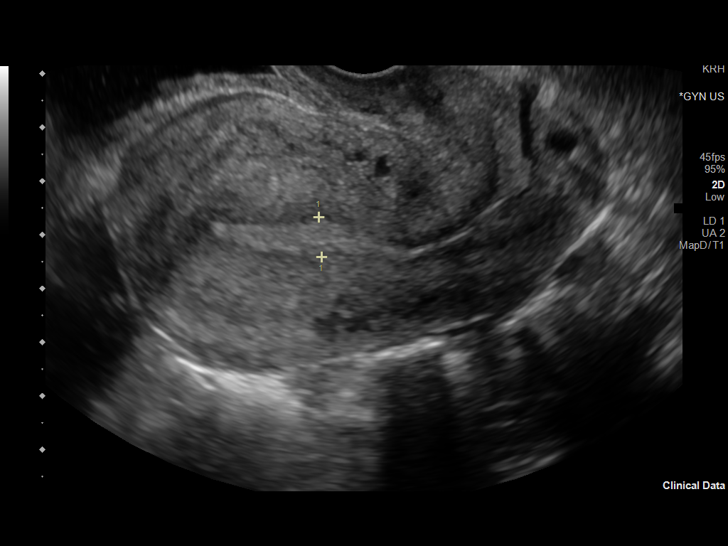
[im 43/73]
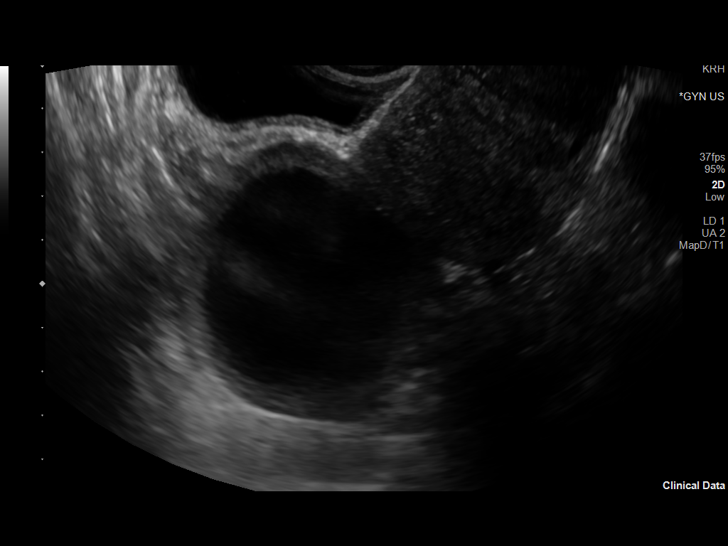
[im 49/73]
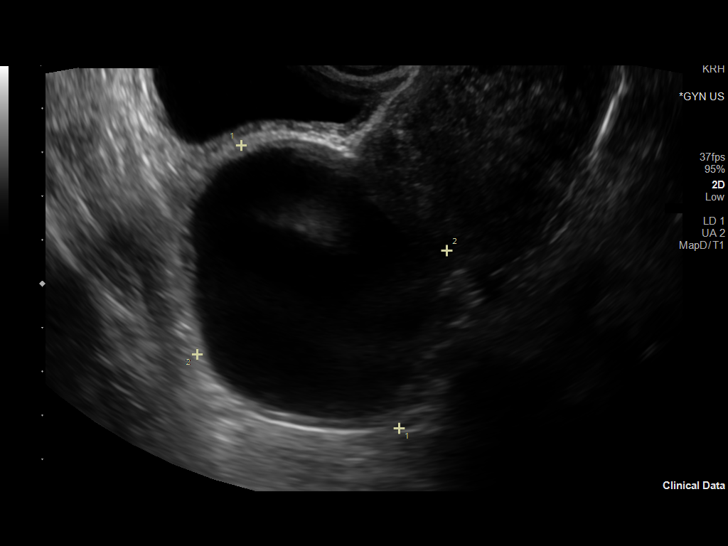
[im 55/73]
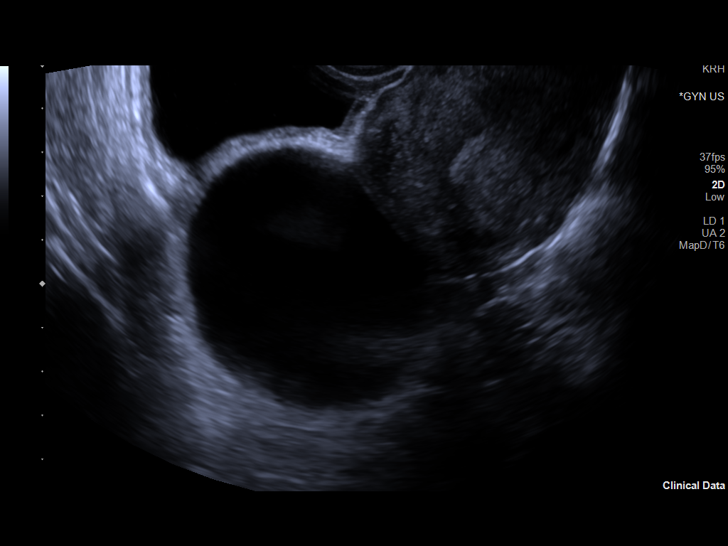
[im 61/73]
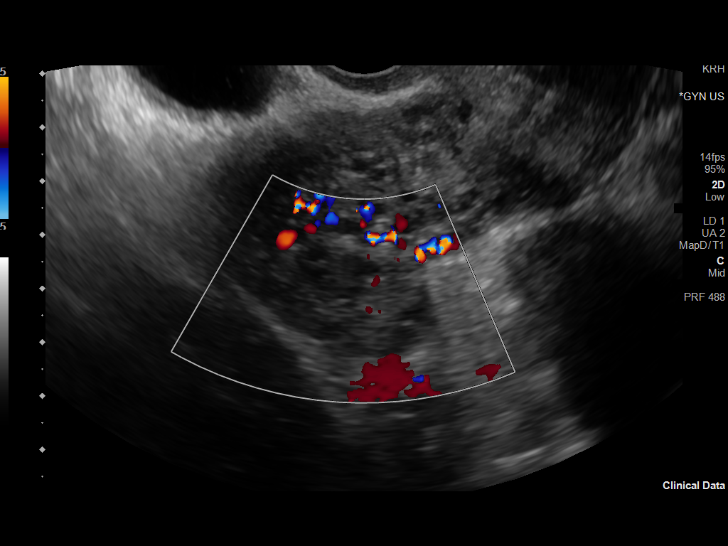
[im 67/73]
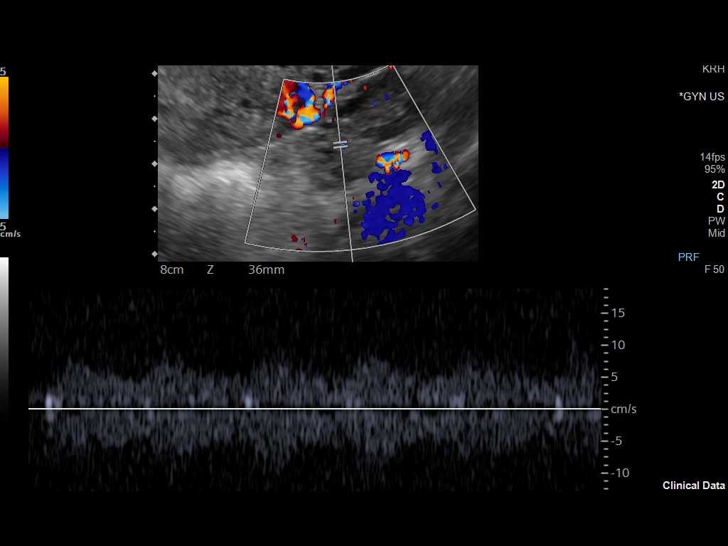
[im 73/73]
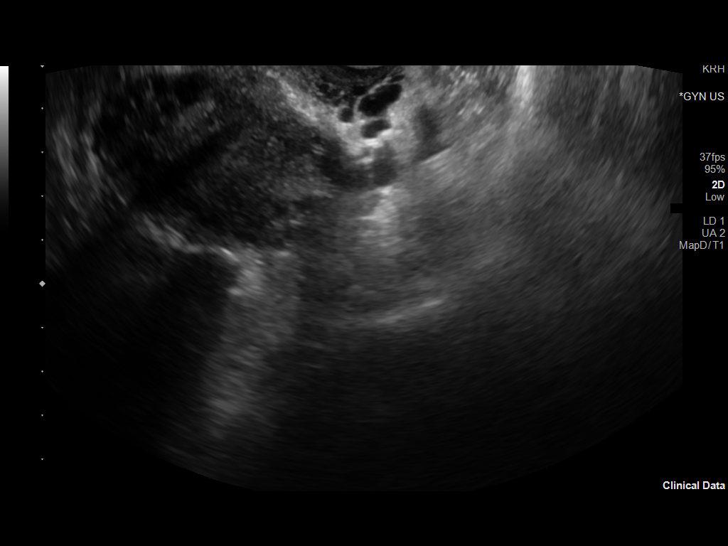

[13 of 25 positions shown; findings below may reference images not displayed]

FINDINGS: Uterus

Measurements: 9.4 x 5.1 x 6.4 cm = volume: 161 mL. No fibroids or
other mass visualized.

Endometrium

Thickness: 7 mm period. A small amount of free fluid in the
endometrial canal.

Right ovary

Measurements: 7.4 x 6.2 x 6.3 cm. = volume: 149 mL. There is a 6.3 x
5.8 x 6 cm cystic structure involving the right ovary. There is some
low level internal echoes.

Left ovary

Measurements: 3.3 x 1.7 x 2 cm = volume: 6 mL. Normal appearance/no
adnexal mass.

Pulsed Doppler evaluation of both ovaries demonstrates normal
low-resistance arterial and venous waveforms.

Other findings

No abnormal free fluid.
IMPRESSION: 1. No acute sonographic abnormality.
2. Mostly simple appearing 6.3 cm cyst arising from the right ovary.
A follow-up ultrasound is recommended in 6-8 weeks to confirm
resolution or stability of this finding.

## 2020-12-02 DIAGNOSIS — Z419 Encounter for procedure for purposes other than remedying health state, unspecified: Secondary | ICD-10-CM | POA: Diagnosis not present

## 2021-01-01 DIAGNOSIS — Z419 Encounter for procedure for purposes other than remedying health state, unspecified: Secondary | ICD-10-CM | POA: Diagnosis not present

## 2021-01-24 ENCOUNTER — Other Ambulatory Visit: Payer: Self-pay | Admitting: Family Medicine

## 2021-01-24 ENCOUNTER — Other Ambulatory Visit: Payer: Self-pay

## 2021-01-24 ENCOUNTER — Ambulatory Visit (INDEPENDENT_AMBULATORY_CARE_PROVIDER_SITE_OTHER): Payer: Medicaid Other

## 2021-01-24 VITALS — BP 133/91 | HR 82 | Wt 208.0 lb

## 2021-01-24 DIAGNOSIS — N898 Other specified noninflammatory disorders of vagina: Secondary | ICD-10-CM

## 2021-01-24 DIAGNOSIS — R399 Unspecified symptoms and signs involving the genitourinary system: Secondary | ICD-10-CM

## 2021-01-24 DIAGNOSIS — Z113 Encounter for screening for infections with a predominantly sexual mode of transmission: Secondary | ICD-10-CM

## 2021-01-24 LAB — POCT URINALYSIS DIPSTICK
Bilirubin, UA: NEGATIVE
Blood, UA: NEGATIVE
Glucose, UA: NEGATIVE
Ketones, UA: NEGATIVE
Leukocytes, UA: NEGATIVE
Nitrite, UA: NEGATIVE
Protein, UA: NEGATIVE
Spec Grav, UA: 1.025 (ref 1.010–1.025)
Urobilinogen, UA: 0.2 E.U./dL
pH, UA: 6 (ref 5.0–8.0)

## 2021-01-24 NOTE — Progress Notes (Signed)
SUBJECTIVE:  25 y.o. female complains of white vaginal discharge for 1 week(s). Denies abnormal vaginal bleeding or significant pelvic pain or fever. No UTI symptoms. Denies history of known exposure to STD.  Patient's last menstrual period was 01/01/2021.  OBJECTIVE:  She appears well, afebrile. Urine dipstick: negative for all components.  ASSESSMENT:  Vaginal Discharge     PLAN:  GC, chlamydia, trichomonas, BVAG, CVAG probe sent to lab. Treatment: To be determined once lab results are received ROV prn if symptoms persist or worsen.

## 2021-01-25 NOTE — Progress Notes (Signed)
Chart reviewed for nurse visit. Agree with plan of care.   Marny Lowenstein, PA-C 01/25/2021 11:30 AM

## 2021-01-26 LAB — CERVICOVAGINAL ANCILLARY ONLY
Bacterial Vaginitis (gardnerella): NEGATIVE
Candida Glabrata: NEGATIVE
Candida Vaginitis: NEGATIVE
Chlamydia: NEGATIVE
Comment: NEGATIVE
Comment: NEGATIVE
Comment: NEGATIVE
Comment: NEGATIVE
Comment: NEGATIVE
Comment: NORMAL
Neisseria Gonorrhea: NEGATIVE
Trichomonas: NEGATIVE

## 2021-02-01 DIAGNOSIS — Z419 Encounter for procedure for purposes other than remedying health state, unspecified: Secondary | ICD-10-CM | POA: Diagnosis not present

## 2021-03-03 DIAGNOSIS — Z419 Encounter for procedure for purposes other than remedying health state, unspecified: Secondary | ICD-10-CM | POA: Diagnosis not present

## 2021-03-27 ENCOUNTER — Other Ambulatory Visit: Payer: Self-pay

## 2021-03-28 ENCOUNTER — Other Ambulatory Visit: Payer: Self-pay

## 2021-03-28 MED ORDER — METRONIDAZOLE 500 MG PO TABS: 500.0000 mg | ORAL_TABLET | Freq: Two times a day (BID) | ORAL | 0 refills | Status: DC

## 2021-03-28 NOTE — Telephone Encounter (Signed)
Patient is requesting a refill on flagyl. Will send in per protocol.

## 2021-04-03 DIAGNOSIS — Z419 Encounter for procedure for purposes other than remedying health state, unspecified: Secondary | ICD-10-CM | POA: Diagnosis not present

## 2021-05-04 DIAGNOSIS — Z419 Encounter for procedure for purposes other than remedying health state, unspecified: Secondary | ICD-10-CM | POA: Diagnosis not present

## 2021-05-05 DIAGNOSIS — G5602 Carpal tunnel syndrome, left upper limb: Secondary | ICD-10-CM | POA: Diagnosis not present

## 2021-06-03 DIAGNOSIS — Z419 Encounter for procedure for purposes other than remedying health state, unspecified: Secondary | ICD-10-CM | POA: Diagnosis not present

## 2021-06-04 IMAGING — US US PELVIS COMPLETE TRANSABD/TRANSVAG W DUPLEX
1 series · 13 of 25 positions shown · non-contrast
Comparison: Pelvic ultrasound 08/31/2019

CLINICAL DATA: C/o RLQ cramping x 2 days. States this is a chronic
issue for a long time and GYN is aware of it. Hx ovarian cyst, bilat
salpingectomy, LEEP.

EXAM:
TRANSABDOMINAL AND TRANSVAGINAL ULTRASOUND OF PELVIS
DOPPLER ULTRASOUND OF OVARIES
TECHNIQUE: Both transabdominal and transvaginal ultrasound examinations of the
pelvis were performed. Transabdominal technique was performed for
global imaging of the pelvis including uterus, ovaries, adnexal
regions, and pelvic cul-de-sac.
It was necessary to proceed with endovaginal exam following the
transabdominal exam to visualize the bilateral ovaries. Color and
duplex Doppler ultrasound was utilized to evaluate blood flow to the
ovaries.

[Series 1: us pelvis complete transabd/transvag w duplex · 13 of 71 slices shown]
[im 1/71]
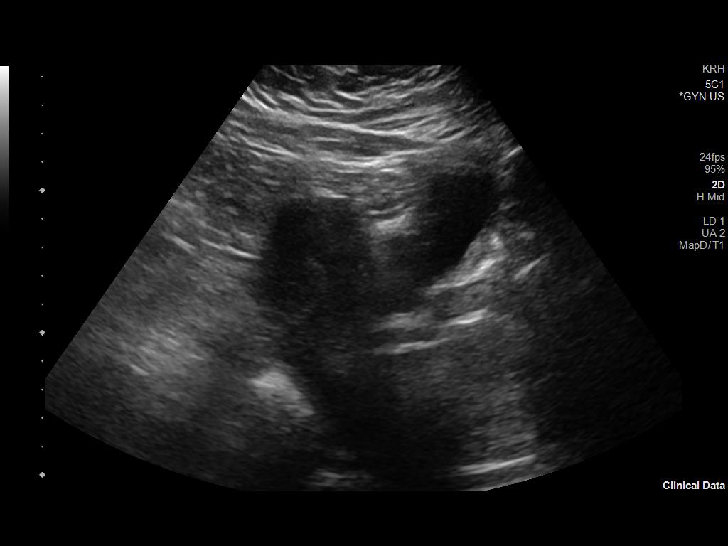
[im 6/71]
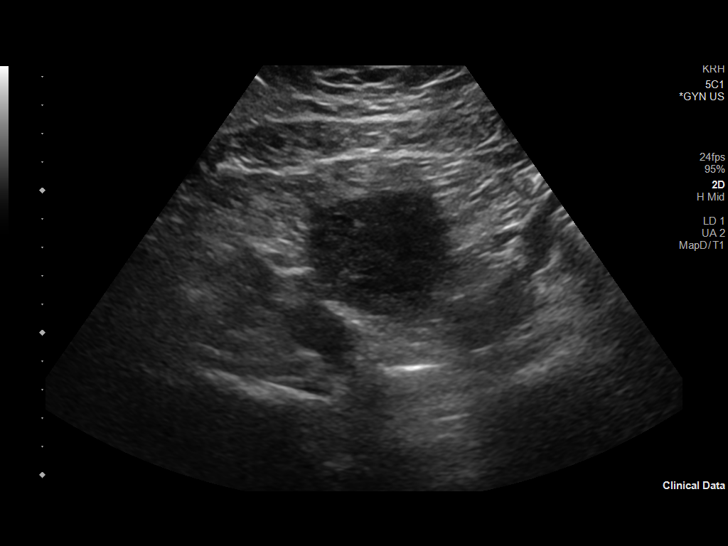
[im 12/71]
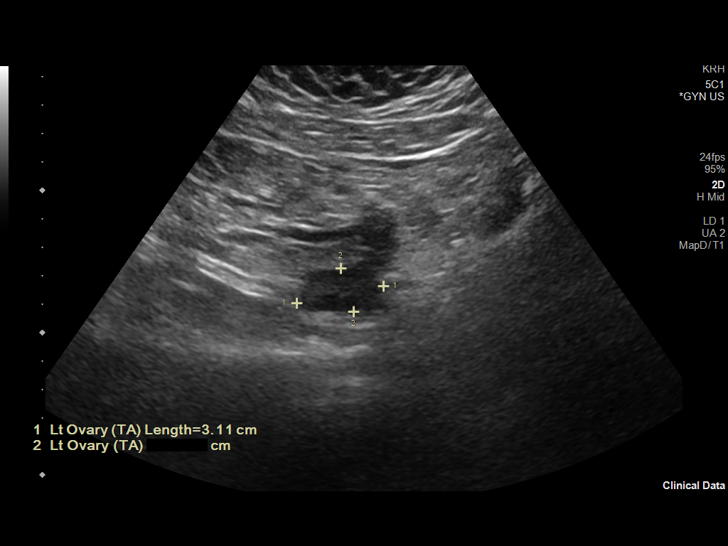
[im 18/71]
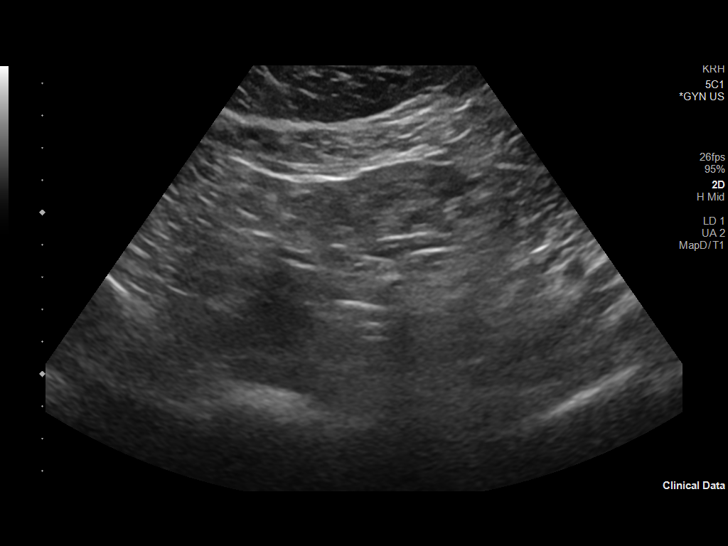
[im 24/71]
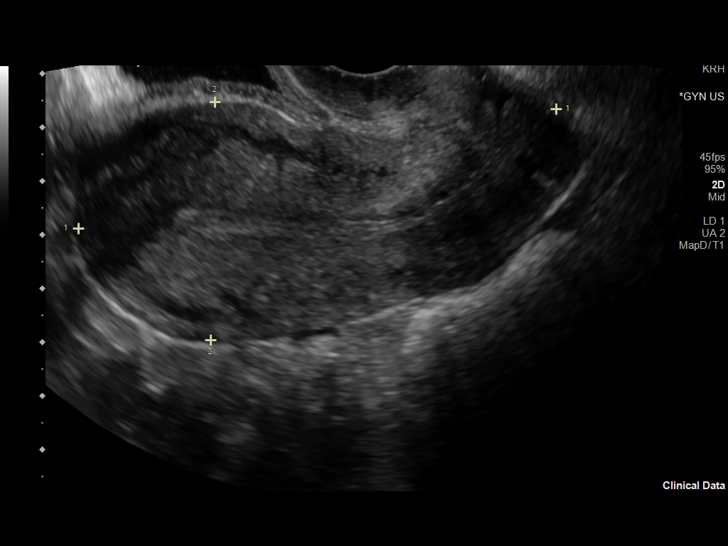
[im 30/71]
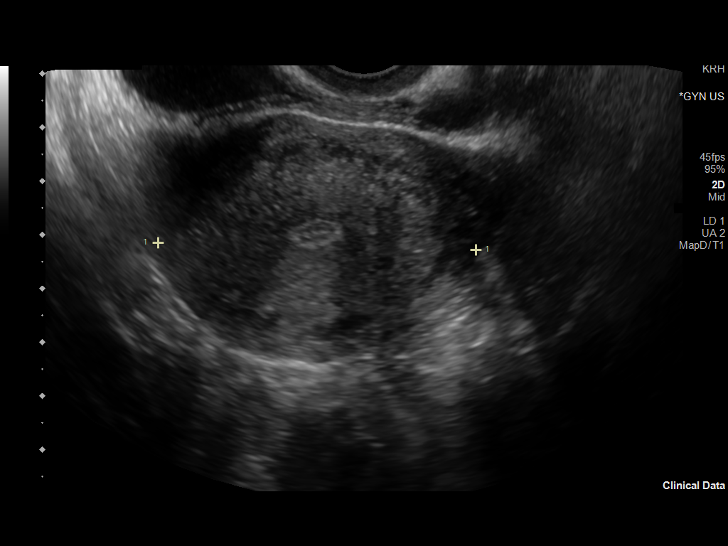
[im 36/71]
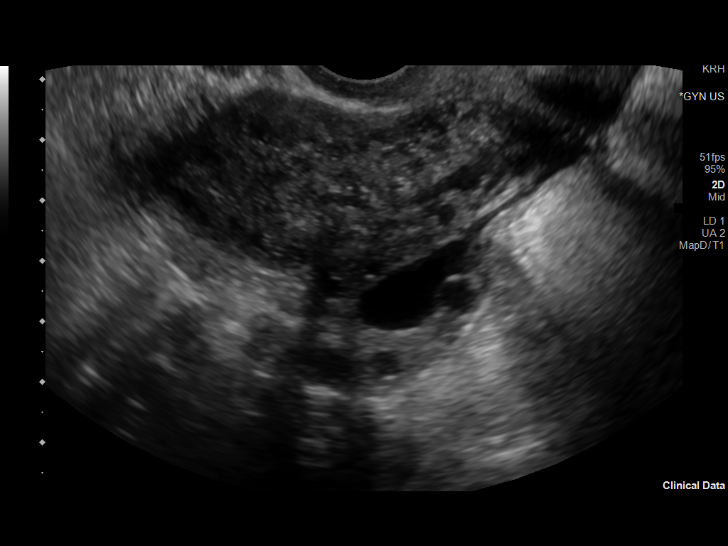
[im 41/71]
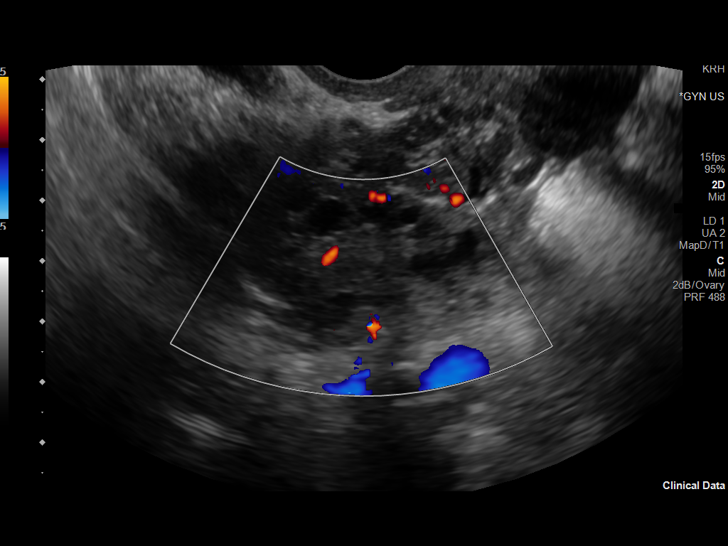
[im 47/71]
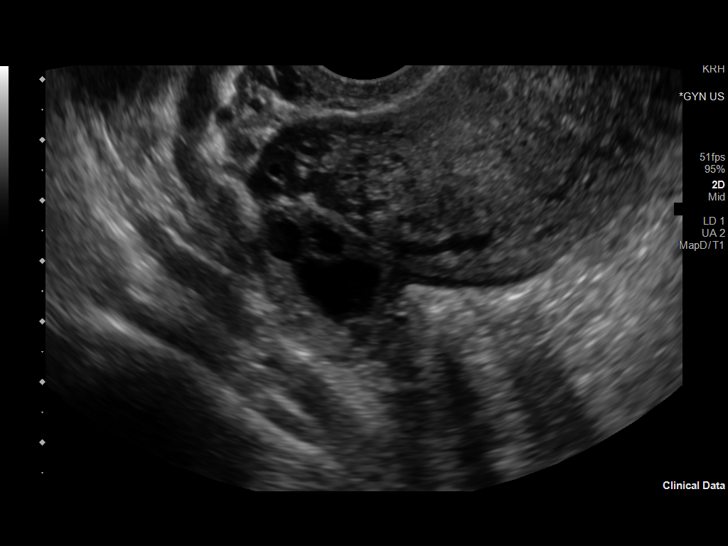
[im 53/71]
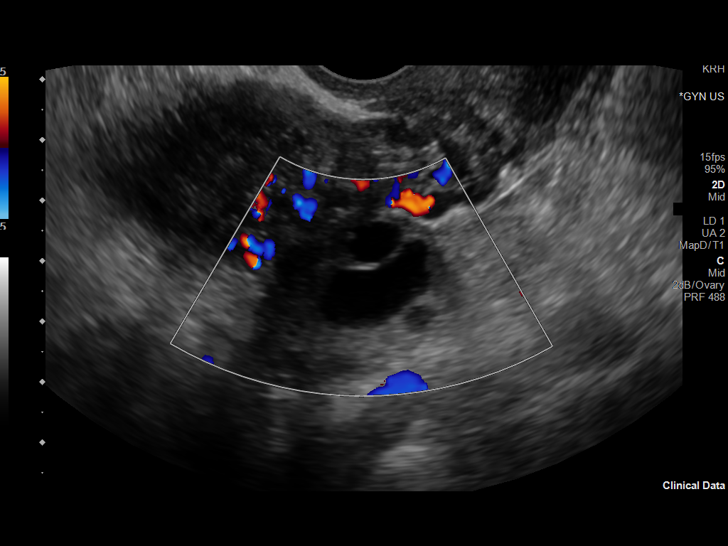
[im 59/71]
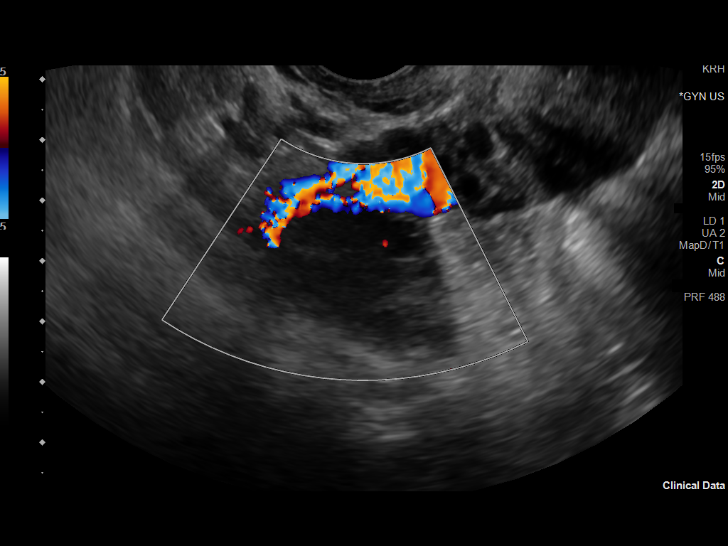
[im 65/71]
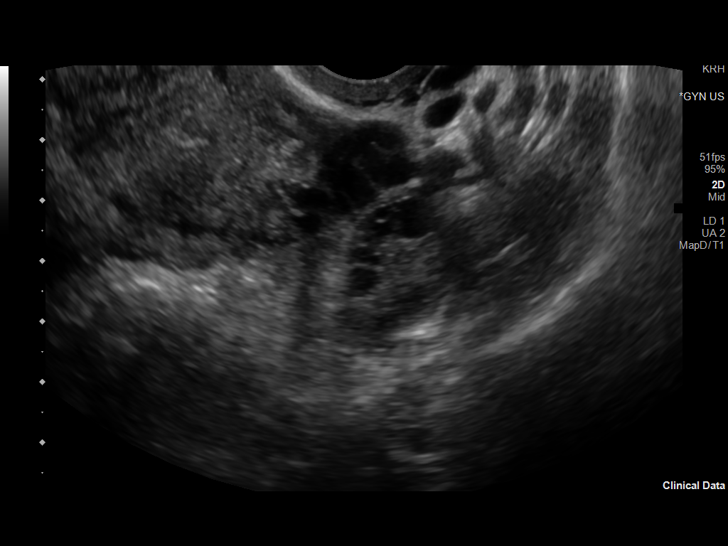
[im 71/71]
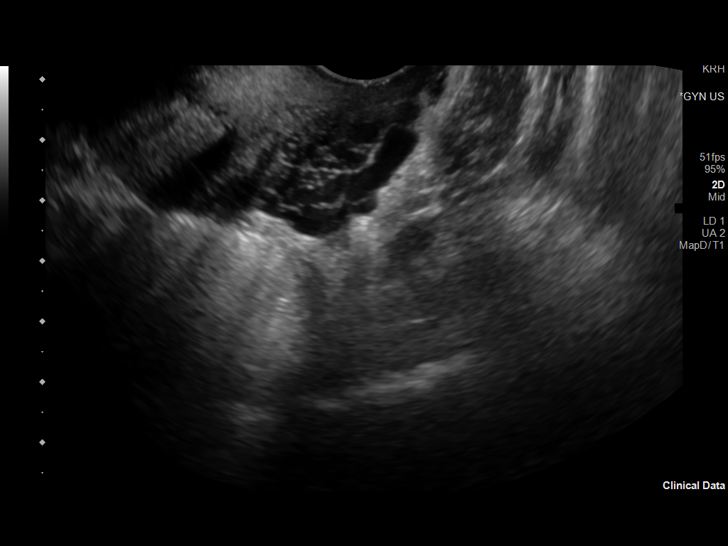

[13 of 25 positions shown; findings below may reference images not displayed]

FINDINGS: Uterus

Measurements: 9.2 x 4.4 x 5.9 cm = volume: 126 mL. No fibroids or
other mass visualized.

Endometrium

Thickness: 0.6 cm.  No focal abnormality visualized.

Right ovary

Measurements: 4.0 x 2.4 x 2.2 cm = volume: 11 mL. Normal
appearance/no adnexal mass.

Left ovary

Measurements: 3.4 x 2.2 x 2.3 cm = volume: 9 mL. Normal
appearance/no adnexal mass.

Pulsed Doppler evaluation of both ovaries demonstrates normal
low-resistance arterial and venous waveforms.

Other findings

No abnormal free fluid.
IMPRESSION: Normal sonographic appearance of the uterus and bilateral ovaries.

## 2021-06-13 ENCOUNTER — Other Ambulatory Visit: Payer: Self-pay | Admitting: Advanced Practice Midwife

## 2021-07-04 DIAGNOSIS — Z419 Encounter for procedure for purposes other than remedying health state, unspecified: Secondary | ICD-10-CM | POA: Diagnosis not present

## 2021-08-03 DIAGNOSIS — Z419 Encounter for procedure for purposes other than remedying health state, unspecified: Secondary | ICD-10-CM | POA: Diagnosis not present

## 2021-08-14 ENCOUNTER — Other Ambulatory Visit: Payer: Self-pay

## 2021-08-14 ENCOUNTER — Other Ambulatory Visit (HOSPITAL_COMMUNITY)
Admission: RE | Admit: 2021-08-14 | Discharge: 2021-08-14 | Disposition: A | Discharge status: Home / Self Care | Payer: Medicaid Other | Source: Ambulatory Visit | Attending: Obstetrics & Gynecology | Admitting: Obstetrics & Gynecology

## 2021-08-14 ENCOUNTER — Encounter: Payer: Self-pay | Admitting: Obstetrics & Gynecology

## 2021-08-14 ENCOUNTER — Ambulatory Visit (INDEPENDENT_AMBULATORY_CARE_PROVIDER_SITE_OTHER): Payer: Medicaid Other | Admitting: Obstetrics & Gynecology

## 2021-08-14 VITALS — BP 134/75 | HR 92 | Ht 63.0 in | Wt 208.0 lb

## 2021-08-14 DIAGNOSIS — Z01419 Encounter for gynecological examination (general) (routine) without abnormal findings: Secondary | ICD-10-CM

## 2021-08-14 DIAGNOSIS — Z113 Encounter for screening for infections with a predominantly sexual mode of transmission: Secondary | ICD-10-CM | POA: Diagnosis not present

## 2021-08-14 DIAGNOSIS — N926 Irregular menstruation, unspecified: Secondary | ICD-10-CM

## 2021-08-14 DIAGNOSIS — N946 Dysmenorrhea, unspecified: Secondary | ICD-10-CM | POA: Diagnosis not present

## 2021-08-14 NOTE — Progress Notes (Signed)
Subjective:     Cindy Martinez is a 25 y.o. female here for a routine exam. Pt reports irreg cycles. Oligomenorrhea. She reports that this is not new and was present prior to having her 2 children. Current complaints: Pt would like labs to check her cycles. She also reports decreased lubrication with intercourse. She would like to be screened for STIs.    Gynecologic History Patient's last menstrual period was 06/21/2021 (exact date). Contraception:  bilateral salpingectomy  Last Pap: 12/21- WNL. 08/2018 [facility].  Last mammogram: n/a.   Obstetric History OB History  Gravida Para Term Preterm AB Living  2 2 2     1   SAB IAB Ectopic Multiple Live Births          1    # Outcome Date GA Lbr Len/2nd Weight Sex Delivery Anes PTL Lv  2 Term 2017 [redacted]w[redacted]d   F Vag-Spont None N LIV  1 Term 2016 [redacted]w[redacted]d   M Vag-Spont None N      The following portions of the patient's history were reviewed and updated as appropriate: allergies, current medications, past family history, past medical history, past social history, past surgical history, and problem list.  Review of Systems Pertinent items are noted in HPI.    Objective:  BP 134/75   Pulse 92   Ht 5\' 3"  (1.6 m)   Wt 208 lb (94.3 kg)   LMP 06/21/2021 (Exact Date)   BMI 36.85 kg/m  General Appearance:    Alert, cooperative, no distress, appears stated age  Head:    Normocephalic, without obvious abnormality, atraumatic  Eyes:    conjunctiva/corneas clear, EOM's intact, both eyes  Ears:    Normal external ear canals, both ears  Nose:   Nares normal, septum midline, mucosa normal, no drainage    or sinus tenderness  Throat:   Lips, mucosa, and tongue normal; teeth and gums normal  Neck:   Supple, symmetrical, trachea midline, no adenopathy;    thyroid:  no enlargement/tenderness/nodules  Back:     Symmetric, no curvature, ROM normal, no CVA tenderness  Lungs:     respirations unlabored  Chest Wall:    No tenderness or deformity   Heart:     Regular rate and rhythm  Breast Exam:    No tenderness, masses, or nipple abnormality  Abdomen:     Soft, non-tender, bowel sounds active all four quadrants,    no masses, no organomegaly  Genitalia:    Normal female without lesion, discharge or tenderness     Extremities:   Extremities normal, atraumatic, no cyanosis or edema; multiple tats. Has a tat sleeve on right arm.    Pulses:   2+ and symmetric all extremities  Skin:   Skin color, texture, turgor normal, no rashes or lesions     Assessment:    Healthy female exam.    Plan:   Cindy Martinez was seen today for annual exam.  Diagnoses and all orders for this visit:  Well female exam with routine gynecological exam -     Cytology - PAP( [facility]) -     HIV antibody (with reflex) -     Hepatitis C Antibody -     Hepatitis B Surface AntiGEN -     RPR  Irregular periods -     FSH -     TSH  Routine screening for STI (sexually transmitted infection)  See above  F/u in 1 year or sooner prn   Pt with a h/o  dysmenorrhea. Prescribed Cataflam. Pt says that it didn't work but, cannot recall the details. Rec trying again. Reviewed how it is to be used to help with dysmenorrhea.   Cindy Martinez, M.D., [facility]

## 2021-08-14 NOTE — Progress Notes (Signed)
Pt was sent to [facility] at [facility] to have labs drawn. Avner Stroder l Marvelle Span, CMA

## 2021-08-15 LAB — HEPATITIS B SURFACE ANTIGEN: Hepatitis B Surface Ag: NEGATIVE

## 2021-08-15 LAB — RPR: RPR Ser Ql: NONREACTIVE

## 2021-08-15 LAB — HIV ANTIBODY (ROUTINE TESTING W REFLEX): HIV Screen 4th Generation wRfx: NONREACTIVE

## 2021-08-15 LAB — HEPATITIS C ANTIBODY: Hep C Virus Ab: <0.1 s/co ratio (ref 0.0–0.9)

## 2021-08-15 LAB — TSH: TSH: 1.72 u[IU]/mL (ref 0.450–4.500)

## 2021-08-15 LAB — FOLLICLE STIMULATING HORMONE: FSH: 2.7 m[IU]/mL

## 2021-08-17 ENCOUNTER — Encounter: Payer: Self-pay | Admitting: Obstetrics & Gynecology

## 2021-08-17 LAB — CYTOLOGY - PAP
Chlamydia: NEGATIVE
Comment: NEGATIVE
Comment: NEGATIVE
Comment: NORMAL
Diagnosis: UNDETERMINED — AB
High risk HPV: NEGATIVE
Neisseria Gonorrhea: NEGATIVE

## 2021-08-18 ENCOUNTER — Telehealth: Payer: Self-pay

## 2021-08-18 NOTE — Telephone Encounter (Signed)
Pt sent My Chart message regarding pap smear results. Explained to pt that Dr. Erin Fulling recommends repeating a pap smear in one year.   Pt states she is not okay with that recommendation given her medical history. Explained to pt if a repeat pap smear is done to soon than same results can show.   Pt states she wants further testing. I will consult with provider.

## 2021-08-22 ENCOUNTER — Telehealth: Payer: Self-pay | Admitting: Obstetrics & Gynecology

## 2021-08-22 NOTE — Telephone Encounter (Signed)
TC to pt. Reviewed PAP results. Reviewed ASCUS. With neg HPV.  REC f/u in 1 year for a repeat PAP.    All questions answered.    Makenzy Krist L. Harraway-Smith, M.D., [place]

## 2021-09-03 DIAGNOSIS — Z419 Encounter for procedure for purposes other than remedying health state, unspecified: Secondary | ICD-10-CM | POA: Diagnosis not present

## 2021-10-04 DIAGNOSIS — Z419 Encounter for procedure for purposes other than remedying health state, unspecified: Secondary | ICD-10-CM | POA: Diagnosis not present

## 2021-11-01 DIAGNOSIS — Z419 Encounter for procedure for purposes other than remedying health state, unspecified: Secondary | ICD-10-CM | POA: Diagnosis not present

## 2021-12-02 DIAGNOSIS — Z419 Encounter for procedure for purposes other than remedying health state, unspecified: Secondary | ICD-10-CM | POA: Diagnosis not present

## 2021-12-30 ENCOUNTER — Other Ambulatory Visit: Payer: Self-pay

## 2021-12-30 ENCOUNTER — Emergency Department (HOSPITAL_BASED_OUTPATIENT_CLINIC_OR_DEPARTMENT_OTHER): Payer: Medicaid Other

## 2021-12-30 ENCOUNTER — Emergency Department (HOSPITAL_BASED_OUTPATIENT_CLINIC_OR_DEPARTMENT_OTHER)
Admission: EM | Admit: 2021-12-30 | Discharge: 2021-12-30 | Disposition: A | Discharge status: Home / Self Care | Payer: Medicaid Other | Attending: Emergency Medicine | Admitting: Emergency Medicine

## 2021-12-30 ENCOUNTER — Encounter (HOSPITAL_BASED_OUTPATIENT_CLINIC_OR_DEPARTMENT_OTHER): Payer: Self-pay | Admitting: Emergency Medicine

## 2021-12-30 DIAGNOSIS — N281 Cyst of kidney, acquired: Secondary | ICD-10-CM | POA: Diagnosis not present

## 2021-12-30 DIAGNOSIS — R7309 Other abnormal glucose: Secondary | ICD-10-CM | POA: Insufficient documentation

## 2021-12-30 DIAGNOSIS — M545 Low back pain, unspecified: Secondary | ICD-10-CM | POA: Insufficient documentation

## 2021-12-30 DIAGNOSIS — R109 Unspecified abdominal pain: Secondary | ICD-10-CM | POA: Insufficient documentation

## 2021-12-30 DIAGNOSIS — I10 Essential (primary) hypertension: Secondary | ICD-10-CM | POA: Diagnosis not present

## 2021-12-30 DIAGNOSIS — Z87442 Personal history of urinary calculi: Secondary | ICD-10-CM | POA: Insufficient documentation

## 2021-12-30 DIAGNOSIS — K828 Other specified diseases of gallbladder: Secondary | ICD-10-CM | POA: Diagnosis not present

## 2021-12-30 DIAGNOSIS — K760 Fatty (change of) liver, not elsewhere classified: Secondary | ICD-10-CM | POA: Diagnosis not present

## 2021-12-30 DIAGNOSIS — G8929 Other chronic pain: Secondary | ICD-10-CM | POA: Diagnosis not present

## 2021-12-30 DIAGNOSIS — N200 Calculus of kidney: Secondary | ICD-10-CM | POA: Diagnosis not present

## 2021-12-30 LAB — CBC
HCT: 43.4 % (ref 36.0–46.0)
Hemoglobin: 14.8 g/dL (ref 12.0–15.0)
MCH: 28.5 pg (ref 26.0–34.0)
MCHC: 34.1 g/dL (ref 30.0–36.0)
MCV: 83.5 fL (ref 80.0–100.0)
Platelets: 414 10*3/uL — ABNORMAL HIGH (ref 150–400)
RBC: 5.2 MIL/uL — ABNORMAL HIGH (ref 3.87–5.11)
RDW: 12.4 % (ref 11.5–15.5)
WBC: 8.5 10*3/uL (ref 4.0–10.5)
nRBC: 0 % (ref 0.0–0.2)

## 2021-12-30 LAB — URINALYSIS, ROUTINE W REFLEX MICROSCOPIC
Bilirubin Urine: NEGATIVE
Glucose, UA: 100 mg/dL — AB
Hgb urine dipstick: NEGATIVE
Ketones, ur: NEGATIVE mg/dL
Leukocytes,Ua: NEGATIVE
Nitrite: NEGATIVE
Protein, ur: NEGATIVE mg/dL
Specific Gravity, Urine: 1.01 (ref 1.005–1.030)
pH: 6 (ref 5.0–8.0)

## 2021-12-30 LAB — BASIC METABOLIC PANEL
Anion gap: 9 (ref 5–15)
BUN: 11 mg/dL (ref 6–20)
CO2: 25 mmol/L (ref 22–32)
Calcium: 9.3 mg/dL (ref 8.9–10.3)
Chloride: 104 mmol/L (ref 98–111)
Creatinine, Ser: 0.74 mg/dL (ref 0.44–1.00)
GFR, Estimated: >60 mL/min (ref >60)
Glucose, Bld: 201 mg/dL — ABNORMAL HIGH (ref 70–99)
Potassium: 3.5 mmol/L (ref 3.5–5.1)
Sodium: 138 mmol/L (ref 135–145)

## 2021-12-30 LAB — PREGNANCY, URINE: Preg Test, Ur: NEGATIVE

## 2021-12-30 NOTE — ED Triage Notes (Signed)
Pt arrives pov, steady gait, c/o bilateral back pain for several weeks then improved, then returned last night, endorses concern for kidney stones ?

## 2021-12-30 NOTE — Discharge Instructions (Signed)
As we discussed, your work-up in the ER today was reassuring for acute abnormalities.  I recommend following up with your primary care doctor for further evaluation and management of your symptoms. ? ?Return if development of any new or worsening symptoms. ?

## 2021-12-31 NOTE — ED Provider Notes (Signed)
?[facility] EMERGENCY DEPARTMENT ?Provider Note ? ? ?CSN: [id number] ?Arrival date & time: 12/30/21  1707 ? ?  ? ?History ? ?Chief Complaint  ?Patient presents with  ? Flank Pain  ? ? ?Cindy Martinez is a 26 y.o. female. ? ?Patient with history of hypertension presents today with complaints of bilateral flank and back pain. She states that she has been having flank pain intermittently on both sides for the past several months with specific return of pain yesterday evening. States that she has a history kidney stones in the past and is concerned that this pain could be related to this. She also states that she has been having low back pain for the past 5 years since she had an epidural during childbirth. She states that this pain has been persistent since then with no acute changes and is triggered by bending and twisting. She has not seen anyone for this pain. No loss of bowel or bladder function or saddle parasthesias. No numbness or tingling in her extremities or sharp shooting pain down her legs. She does not smoke or drink alcohol and denies any recreational drug use or IVDU. ? ?The history is provided by the patient. No language interpreter was used.  ?Flank Pain ?Pertinent negatives include no abdominal pain.  ? ?  ? ?Home Medications ?Prior to Admission medications   ?Medication Sig Start Date End Date Taking? Authorizing Provider  ?busPIRone (BUSPAR) 10 MG tablet Take 15 mg by mouth 3 (three) times daily.    [provider]  ?cholecalciferol (VITAMIN D3) 25 MCG (1000 UNIT) tablet Take 1,000 Units by mouth daily. ?Patient not taking: Reported on 01/24/2021    [provider]  ?diclofenac (CATAFLAM) 50 MG tablet Take 1 tablet (50 mg total) by mouth 3 (three) times daily. ?Patient not taking: Reported on 08/14/2021 08/08/20   Willodean Rosenthal, MD  ?doxycycline (VIBRAMYCIN) 100 MG capsule Take 1 capsule (100 mg total) by mouth 2 (two) times daily. One po bid x 7 days ?Patient not  taking: Reported on 06/30/2020 03/19/20   Molpus, John, MD  ?escitalopram (LEXAPRO) 10 MG tablet Take 10 mg by mouth daily.    [provider]  ?fluconazole (DIFLUCAN) 150 MG tablet TAKE 1 TABLET BY MOUTH ONCE FOR 1 DOSE. CAN TAKE ADDITIONAL THREE DAYS LATER IF SYMPTOMS PERSIST 01/31/21   Levie Heritage, DO  ?indapamide (LOZOL) 1.25 MG tablet Take 1.25 mg by mouth daily.  ?Patient not taking: Reported on 06/30/2020    [provider]  ?metroNIDAZOLE (FLAGYL) 500 MG tablet Take 1 tablet (500 mg total) by mouth 2 (two) times daily. ?Patient not taking: Reported on 06/30/2020 03/30/20   Willodean Rosenthal, MD  ?metroNIDAZOLE (FLAGYL) 500 MG tablet Take 1 tablet (500 mg total) by mouth 2 (two) times daily. ?Patient not taking: Reported on 08/14/2021 03/28/21   Aviva Signs, CNM  ?Norethindrone Acetate-Ethinyl Estrad-FE (LOESTRIN 24 FE) 1-20 MG-MCG(24) tablet Take 1 tablet by mouth daily. ?Patient not taking: Reported on 06/30/2020 11/12/19   Willodean Rosenthal, MD  ?traZODone (DESYREL) 100 MG tablet Take 100 mg by mouth at bedtime.    [provider]  ?prochlorperazine (COMPAZINE) 10 MG tablet Take 1 tablet (10 mg total) by mouth every 6 (six) hours as needed for nausea or vomiting. ?Patient not taking: Reported on 10/23/2019 10/16/19 03/19/20  Dione Booze, MD  ?   ? ?Allergies    ?Omnipaque [iohexol]   ? ?Review of Systems   ?Review of Systems  ?Constitutional:  Negative for chills and fever.  ?Gastrointestinal:  Negative for abdominal pain.  ?Genitourinary:  Positive for flank pain.  ?Musculoskeletal:  Positive for back pain. Negative for gait problem, neck pain and neck stiffness.  ?Skin:  Negative for rash and wound.  ? ?Physical Exam ?Updated Vital Signs ?BP 123/87   Pulse 96   Temp 99 ?F (37.2 ?C) (Oral)   Resp 17   Ht 5\' 3"  (1.6 m)   Wt 93 kg   LMP 12/20/2021   SpO2 98%   BMI 36.31 kg/m?  ?Physical Exam ?Vitals and nursing note reviewed.  ?Constitutional:   ?    General: She is not in acute distress. ?   Appearance: Normal appearance. She is normal weight. She is not ill-appearing, toxic-appearing or diaphoretic.  ?HENT:  ?   Head: Normocephalic and atraumatic.  ?Cardiovascular:  ?   Rate and Rhythm: Normal rate.  ?Pulmonary:  ?   Effort: Pulmonary effort is normal. No respiratory distress.  ?Abdominal:  ?   General: Abdomen is flat.  ?   Palpations: Abdomen is soft.  ?   Tenderness: There is no abdominal tenderness. There is no right CVA tenderness or left CVA tenderness.  ?Musculoskeletal:     ?   General: Normal range of motion.  ?   Cervical back: Normal range of motion.  ?   Comments: No midline cervical, thoracic, or lumbar tenderness. No overlying skin changes. Negative straight leg raise. DP and PT pulses intact and 2+ bilaterally  ?Skin: ?   General: Skin is warm and dry.  ?Neurological:  ?   General: No focal deficit present.  ?   Mental Status: She is alert.  ?Psychiatric:     ?   Mood and Affect: Mood normal.     ?   Behavior: Behavior normal.  ? ? ?ED Results / Procedures / Treatments   ?Labs ?(all labs ordered are listed, but only abnormal results are displayed) ?Labs Reviewed  ?URINALYSIS, ROUTINE W REFLEX MICROSCOPIC - Abnormal; Notable for the following components:  ?    Result Value  ? Color, Urine STRAW (*)   ? Glucose, UA 100 (*)   ? All other components within normal limits  ?BASIC METABOLIC PANEL - Abnormal; Notable for the following components:  ? Glucose, Bld 201 (*)   ? All other components within normal limits  ?CBC - Abnormal; Notable for the following components:  ? RBC 5.20 (*)   ? Platelets 414 (*)   ? All other components within normal limits  ?PREGNANCY, URINE  ? ? ?EKG ?None ? ?Radiology ?CT L-SPINE NO CHARGE ? ?Result Date: 12/30/2021 ?CLINICAL DATA:  Bilateral back pain, concern for kidney stones EXAM: CT LUMBAR SPINE WITHOUT CONTRAST TECHNIQUE: Multidetector CT imaging of the lumbar spine was performed without intravenous contrast  administration. Multiplanar CT image reconstructions were also generated. RADIATION DOSE REDUCTION: This exam was performed according to the departmental dose-optimization program which includes automated exposure control, adjustment of the mA and/or kV according to patient size and/or use of iterative reconstruction technique. COMPARISON:  No prior CT of the lumbar spine, correlation is made with CT abdomen pelvis 01/30/2020 FINDINGS: Segmentation: 5 lumbar type vertebrae. Alignment: No listhesis. Vertebrae: No acute fracture or focal pathologic process. Paraspinal and other soft tissues: Please see same-day CT abdomen pelvis. Disc levels: Disc heights are preserved. No spinal canal stenosis, significant facet arthropathy, or neural foraminal narrowing. IMPRESSION: No acute fracture or listhesis. No spinal canal stenosis or neural foraminal  narrowing. Electronically Signed   By: Wiliam Ke M.D.   On: 12/30/2021 22:24  ? ?CT Renal Stone Study ? ?Result Date: 12/30/2021 ?CLINICAL DATA:  Flank pain, kidney stone suspected Back pain. EXAM: CT ABDOMEN AND PELVIS WITHOUT CONTRAST TECHNIQUE: Multidetector CT imaging of the abdomen and pelvis was performed following the standard protocol without IV contrast. RADIATION DOSE REDUCTION: This exam was performed according to the departmental dose-optimization program which includes automated exposure control, adjustment of the mA and/or kV according to patient size and/or use of iterative reconstruction technique. COMPARISON:  Concurrent lumbar spine reformats, reported separately. Abdominopelvic CT 01/30/2020 FINDINGS: Lower chest: No focal airspace disease or pleural effusion. The heart is normal in size. Hepatobiliary: Diffusely decreased hepatic density consistent with steatosis. There is focal fatty sparing adjacent to the gallbladder. Gallbladder physiologically distended, no calcified stone. No biliary dilatation. Pancreas: No ductal dilatation or inflammation. Spleen:  Normal in size without focal abnormality. Adrenals/Urinary Tract: No adrenal nodule. 4 mm nonobstructing stone in the lower left kidney. There may be a punctate nonobstructing stone in the lower right kidney. No hydronephro

## 2022-01-01 DIAGNOSIS — D539 Nutritional anemia, unspecified: Secondary | ICD-10-CM | POA: Diagnosis not present

## 2022-01-01 DIAGNOSIS — M545 Low back pain, unspecified: Secondary | ICD-10-CM | POA: Diagnosis not present

## 2022-01-01 DIAGNOSIS — Z6837 Body mass index (BMI) 37.0-37.9, adult: Secondary | ICD-10-CM | POA: Diagnosis not present

## 2022-01-01 DIAGNOSIS — Z419 Encounter for procedure for purposes other than remedying health state, unspecified: Secondary | ICD-10-CM | POA: Diagnosis not present

## 2022-01-01 DIAGNOSIS — E559 Vitamin D deficiency, unspecified: Secondary | ICD-10-CM | POA: Diagnosis not present

## 2022-01-01 DIAGNOSIS — N83209 Unspecified ovarian cyst, unspecified side: Secondary | ICD-10-CM | POA: Diagnosis not present

## 2022-01-01 DIAGNOSIS — R1084 Generalized abdominal pain: Secondary | ICD-10-CM | POA: Diagnosis not present

## 2022-01-01 DIAGNOSIS — R5383 Other fatigue: Secondary | ICD-10-CM | POA: Diagnosis not present

## 2022-01-01 DIAGNOSIS — R030 Elevated blood-pressure reading, without diagnosis of hypertension: Secondary | ICD-10-CM | POA: Diagnosis not present

## 2022-01-16 DIAGNOSIS — K760 Fatty (change of) liver, not elsewhere classified: Secondary | ICD-10-CM | POA: Diagnosis not present

## 2022-01-16 DIAGNOSIS — M545 Low back pain, unspecified: Secondary | ICD-10-CM | POA: Diagnosis not present

## 2022-01-16 DIAGNOSIS — Z6837 Body mass index (BMI) 37.0-37.9, adult: Secondary | ICD-10-CM | POA: Diagnosis not present

## 2022-01-16 DIAGNOSIS — R030 Elevated blood-pressure reading, without diagnosis of hypertension: Secondary | ICD-10-CM | POA: Diagnosis not present

## 2022-01-16 DIAGNOSIS — E559 Vitamin D deficiency, unspecified: Secondary | ICD-10-CM | POA: Diagnosis not present

## 2022-01-18 DIAGNOSIS — K760 Fatty (change of) liver, not elsewhere classified: Secondary | ICD-10-CM | POA: Diagnosis not present

## 2022-01-31 DIAGNOSIS — Z6836 Body mass index (BMI) 36.0-36.9, adult: Secondary | ICD-10-CM | POA: Diagnosis not present

## 2022-01-31 DIAGNOSIS — F411 Generalized anxiety disorder: Secondary | ICD-10-CM | POA: Diagnosis not present

## 2022-01-31 DIAGNOSIS — R0602 Shortness of breath: Secondary | ICD-10-CM | POA: Diagnosis not present

## 2022-01-31 DIAGNOSIS — R635 Abnormal weight gain: Secondary | ICD-10-CM | POA: Diagnosis not present

## 2022-01-31 DIAGNOSIS — K760 Fatty (change of) liver, not elsewhere classified: Secondary | ICD-10-CM | POA: Diagnosis not present

## 2022-02-01 DIAGNOSIS — Z419 Encounter for procedure for purposes other than remedying health state, unspecified: Secondary | ICD-10-CM | POA: Diagnosis not present

## 2022-02-27 DIAGNOSIS — M545 Low back pain, unspecified: Secondary | ICD-10-CM | POA: Diagnosis not present

## 2022-02-27 DIAGNOSIS — G8929 Other chronic pain: Secondary | ICD-10-CM | POA: Diagnosis not present

## 2022-02-28 ENCOUNTER — Other Ambulatory Visit: Payer: Self-pay | Admitting: Advanced Practice Midwife

## 2022-03-01 MED ORDER — METRONIDAZOLE 500 MG PO TABS
500.0000 mg | ORAL_TABLET | Freq: Two times a day (BID) | ORAL | 0 refills | Status: DC
Start: 2022-03-01 — End: 2022-06-04

## 2022-03-02 DIAGNOSIS — R635 Abnormal weight gain: Secondary | ICD-10-CM | POA: Diagnosis not present

## 2022-03-02 DIAGNOSIS — M545 Low back pain, unspecified: Secondary | ICD-10-CM | POA: Diagnosis not present

## 2022-03-02 DIAGNOSIS — K760 Fatty (change of) liver, not elsewhere classified: Secondary | ICD-10-CM | POA: Diagnosis not present

## 2022-03-02 DIAGNOSIS — Z6835 Body mass index (BMI) 35.0-35.9, adult: Secondary | ICD-10-CM | POA: Diagnosis not present

## 2022-03-03 DIAGNOSIS — Z419 Encounter for procedure for purposes other than remedying health state, unspecified: Secondary | ICD-10-CM | POA: Diagnosis not present

## 2022-03-19 DIAGNOSIS — Z79899 Other long term (current) drug therapy: Secondary | ICD-10-CM | POA: Diagnosis not present

## 2022-03-19 DIAGNOSIS — E7800 Pure hypercholesterolemia, unspecified: Secondary | ICD-10-CM | POA: Diagnosis not present

## 2022-03-19 DIAGNOSIS — E559 Vitamin D deficiency, unspecified: Secondary | ICD-10-CM | POA: Diagnosis not present

## 2022-03-19 DIAGNOSIS — D539 Nutritional anemia, unspecified: Secondary | ICD-10-CM | POA: Diagnosis not present

## 2022-03-19 DIAGNOSIS — K760 Fatty (change of) liver, not elsewhere classified: Secondary | ICD-10-CM | POA: Diagnosis not present

## 2022-03-19 DIAGNOSIS — M545 Low back pain, unspecified: Secondary | ICD-10-CM | POA: Diagnosis not present

## 2022-03-19 DIAGNOSIS — Z6833 Body mass index (BMI) 33.0-33.9, adult: Secondary | ICD-10-CM | POA: Diagnosis not present

## 2022-03-30 DIAGNOSIS — M545 Low back pain, unspecified: Secondary | ICD-10-CM | POA: Diagnosis not present

## 2022-03-30 DIAGNOSIS — R635 Abnormal weight gain: Secondary | ICD-10-CM | POA: Diagnosis not present

## 2022-03-30 DIAGNOSIS — Z6833 Body mass index (BMI) 33.0-33.9, adult: Secondary | ICD-10-CM | POA: Diagnosis not present

## 2022-03-30 DIAGNOSIS — E538 Deficiency of other specified B group vitamins: Secondary | ICD-10-CM | POA: Diagnosis not present

## 2022-03-30 DIAGNOSIS — K760 Fatty (change of) liver, not elsewhere classified: Secondary | ICD-10-CM | POA: Diagnosis not present

## 2022-04-03 DIAGNOSIS — Z419 Encounter for procedure for purposes other than remedying health state, unspecified: Secondary | ICD-10-CM | POA: Diagnosis not present

## 2022-04-10 DIAGNOSIS — M545 Low back pain, unspecified: Secondary | ICD-10-CM | POA: Diagnosis not present

## 2022-04-10 DIAGNOSIS — G8929 Other chronic pain: Secondary | ICD-10-CM | POA: Diagnosis not present

## 2022-04-30 DIAGNOSIS — R635 Abnormal weight gain: Secondary | ICD-10-CM | POA: Diagnosis not present

## 2022-04-30 DIAGNOSIS — Z6831 Body mass index (BMI) 31.0-31.9, adult: Secondary | ICD-10-CM | POA: Diagnosis not present

## 2022-04-30 DIAGNOSIS — E538 Deficiency of other specified B group vitamins: Secondary | ICD-10-CM | POA: Diagnosis not present

## 2022-04-30 DIAGNOSIS — K760 Fatty (change of) liver, not elsewhere classified: Secondary | ICD-10-CM | POA: Diagnosis not present

## 2022-04-30 DIAGNOSIS — M545 Low back pain, unspecified: Secondary | ICD-10-CM | POA: Diagnosis not present

## 2022-05-04 DIAGNOSIS — Z419 Encounter for procedure for purposes other than remedying health state, unspecified: Secondary | ICD-10-CM | POA: Diagnosis not present

## 2022-05-31 DIAGNOSIS — Z6829 Body mass index (BMI) 29.0-29.9, adult: Secondary | ICD-10-CM | POA: Diagnosis not present

## 2022-05-31 DIAGNOSIS — R635 Abnormal weight gain: Secondary | ICD-10-CM | POA: Diagnosis not present

## 2022-05-31 DIAGNOSIS — M545 Low back pain, unspecified: Secondary | ICD-10-CM | POA: Diagnosis not present

## 2022-05-31 DIAGNOSIS — Z79899 Other long term (current) drug therapy: Secondary | ICD-10-CM | POA: Diagnosis not present

## 2022-06-03 DIAGNOSIS — Z419 Encounter for procedure for purposes other than remedying health state, unspecified: Secondary | ICD-10-CM | POA: Diagnosis not present

## 2022-06-04 ENCOUNTER — Other Ambulatory Visit: Payer: Self-pay | Admitting: Family Medicine

## 2022-06-05 MED ORDER — METRONIDAZOLE 500 MG PO TABS
500.0000 mg | ORAL_TABLET | Freq: Two times a day (BID) | ORAL | 0 refills | Status: DC
Start: 2022-06-05 — End: 2022-07-10

## 2022-06-28 DIAGNOSIS — E7800 Pure hypercholesterolemia, unspecified: Secondary | ICD-10-CM | POA: Diagnosis not present

## 2022-06-28 DIAGNOSIS — Z6828 Body mass index (BMI) 28.0-28.9, adult: Secondary | ICD-10-CM | POA: Diagnosis not present

## 2022-06-28 DIAGNOSIS — R635 Abnormal weight gain: Secondary | ICD-10-CM | POA: Diagnosis not present

## 2022-06-28 DIAGNOSIS — E559 Vitamin D deficiency, unspecified: Secondary | ICD-10-CM | POA: Diagnosis not present

## 2022-06-28 DIAGNOSIS — R7309 Other abnormal glucose: Secondary | ICD-10-CM | POA: Diagnosis not present

## 2022-06-28 DIAGNOSIS — D539 Nutritional anemia, unspecified: Secondary | ICD-10-CM | POA: Diagnosis not present

## 2022-06-28 DIAGNOSIS — R5383 Other fatigue: Secondary | ICD-10-CM | POA: Diagnosis not present

## 2022-07-04 DIAGNOSIS — Z419 Encounter for procedure for purposes other than remedying health state, unspecified: Secondary | ICD-10-CM | POA: Diagnosis not present

## 2022-07-09 ENCOUNTER — Other Ambulatory Visit: Payer: Self-pay

## 2022-07-09 ENCOUNTER — Emergency Department (HOSPITAL_BASED_OUTPATIENT_CLINIC_OR_DEPARTMENT_OTHER): Payer: Medicaid Other

## 2022-07-09 ENCOUNTER — Inpatient Hospital Stay (HOSPITAL_BASED_OUTPATIENT_CLINIC_OR_DEPARTMENT_OTHER)
Admission: EM | Admit: 2022-07-09 | Discharge: 2022-07-10 | DRG: 206 | Disposition: A | Discharge status: Home / Self Care | Payer: Medicaid Other | Attending: Family Medicine | Admitting: Family Medicine

## 2022-07-09 ENCOUNTER — Encounter (HOSPITAL_BASED_OUTPATIENT_CLINIC_OR_DEPARTMENT_OTHER): Payer: Self-pay | Admitting: Emergency Medicine

## 2022-07-09 ENCOUNTER — Encounter (HOSPITAL_COMMUNITY): Payer: Self-pay

## 2022-07-09 ENCOUNTER — Inpatient Hospital Stay (HOSPITAL_COMMUNITY): Payer: Medicaid Other

## 2022-07-09 DIAGNOSIS — K5730 Diverticulosis of large intestine without perforation or abscess without bleeding: Secondary | ICD-10-CM | POA: Diagnosis not present

## 2022-07-09 DIAGNOSIS — J988 Other specified respiratory disorders: Principal | ICD-10-CM | POA: Diagnosis present

## 2022-07-09 DIAGNOSIS — I10 Essential (primary) hypertension: Secondary | ICD-10-CM | POA: Diagnosis present

## 2022-07-09 DIAGNOSIS — Z888 Allergy status to other drugs, medicaments and biological substances status: Secondary | ICD-10-CM | POA: Diagnosis not present

## 2022-07-09 DIAGNOSIS — R6510 Systemic inflammatory response syndrome (SIRS) of non-infectious origin without acute organ dysfunction: Secondary | ICD-10-CM | POA: Diagnosis present

## 2022-07-09 DIAGNOSIS — M25511 Pain in right shoulder: Secondary | ICD-10-CM | POA: Diagnosis not present

## 2022-07-09 DIAGNOSIS — R000 Tachycardia, unspecified: Secondary | ICD-10-CM | POA: Diagnosis not present

## 2022-07-09 DIAGNOSIS — N200 Calculus of kidney: Secondary | ICD-10-CM | POA: Diagnosis not present

## 2022-07-09 DIAGNOSIS — Z20822 Contact with and (suspected) exposure to covid-19: Secondary | ICD-10-CM | POA: Diagnosis present

## 2022-07-09 DIAGNOSIS — R509 Fever, unspecified: Secondary | ICD-10-CM | POA: Diagnosis not present

## 2022-07-09 DIAGNOSIS — N3000 Acute cystitis without hematuria: Secondary | ICD-10-CM | POA: Diagnosis not present

## 2022-07-09 DIAGNOSIS — K760 Fatty (change of) liver, not elsewhere classified: Secondary | ICD-10-CM | POA: Diagnosis not present

## 2022-07-09 DIAGNOSIS — R1011 Right upper quadrant pain: Secondary | ICD-10-CM | POA: Diagnosis present

## 2022-07-09 DIAGNOSIS — R1012 Left upper quadrant pain: Secondary | ICD-10-CM | POA: Diagnosis not present

## 2022-07-09 DIAGNOSIS — Z79899 Other long term (current) drug therapy: Secondary | ICD-10-CM

## 2022-07-09 DIAGNOSIS — R079 Chest pain, unspecified: Secondary | ICD-10-CM | POA: Diagnosis not present

## 2022-07-09 DIAGNOSIS — J069 Acute upper respiratory infection, unspecified: Secondary | ICD-10-CM | POA: Diagnosis not present

## 2022-07-09 LAB — CBC WITH DIFFERENTIAL/PLATELET
Abs Immature Granulocytes: 0.04 10*3/uL (ref 0.00–0.07)
Abs Immature Granulocytes: 0.03 10*3/uL (ref 0.00–0.07)
Basophils Absolute: 0.1 10*3/uL (ref 0.0–0.1)
Basophils Absolute: 0 10*3/uL (ref 0.0–0.1)
Basophils Relative: 0 %
Basophils Relative: 0 %
Eosinophils Absolute: 0.1 10*3/uL (ref 0.0–0.5)
Eosinophils Absolute: 0 10*3/uL (ref 0.0–0.5)
Eosinophils Relative: 1 %
Eosinophils Relative: 0 %
HCT: 39.8 % (ref 36.0–46.0)
HCT: 37 % (ref 36.0–46.0)
Hemoglobin: 13.5 g/dL (ref 12.0–15.0)
Hemoglobin: 12.6 g/dL (ref 12.0–15.0)
Immature Granulocytes: 0 %
Immature Granulocytes: 0 %
Lymphocytes Relative: 20 %
Lymphocytes Relative: 19 %
Lymphs Abs: 2.6 10*3/uL (ref 0.7–4.0)
Lymphs Abs: 1.7 10*3/uL (ref 0.7–4.0)
MCH: 29 pg (ref 26.0–34.0)
MCH: 29.2 pg (ref 26.0–34.0)
MCHC: 33.9 g/dL (ref 30.0–36.0)
MCHC: 34.1 g/dL (ref 30.0–36.0)
MCV: 85.4 fL (ref 80.0–100.0)
MCV: 85.8 fL (ref 80.0–100.0)
Monocytes Absolute: 0.9 10*3/uL (ref 0.1–1.0)
Monocytes Absolute: 0.6 10*3/uL (ref 0.1–1.0)
Monocytes Relative: 7 %
Monocytes Relative: 7 %
Neutro Abs: 9.3 10*3/uL — ABNORMAL HIGH (ref 1.7–7.7)
Neutro Abs: 6.5 10*3/uL (ref 1.7–7.7)
Neutrophils Relative %: 72 %
Neutrophils Relative %: 74 %
Platelets: 350 10*3/uL (ref 150–400)
Platelets: 302 10*3/uL (ref 150–400)
RBC: 4.66 MIL/uL (ref 3.87–5.11)
RBC: 4.31 MIL/uL (ref 3.87–5.11)
RDW: 12.6 % (ref 11.5–15.5)
RDW: 12.5 % (ref 11.5–15.5)
WBC: 12.9 10*3/uL — ABNORMAL HIGH (ref 4.0–10.5)
WBC: 8.8 10*3/uL (ref 4.0–10.5)
nRBC: 0 % (ref 0.0–0.2)
nRBC: 0 % (ref 0.0–0.2)

## 2022-07-09 LAB — COMPREHENSIVE METABOLIC PANEL
ALT: 36 U/L (ref 0–44)
ALT: 29 U/L (ref 0–44)
AST: 31 U/L (ref 15–41)
AST: 19 U/L (ref 15–41)
Albumin: 4.4 g/dL (ref 3.5–5.0)
Albumin: 4 g/dL (ref 3.5–5.0)
Alkaline Phosphatase: 85 U/L (ref 38–126)
Alkaline Phosphatase: 63 U/L (ref 38–126)
Anion gap: 9 (ref 5–15)
Anion gap: 7 (ref 5–15)
BUN: 14 mg/dL (ref 6–20)
BUN: 7 mg/dL (ref 6–20)
CO2: 25 mmol/L (ref 22–32)
CO2: 25 mmol/L (ref 22–32)
Calcium: 8.7 mg/dL — ABNORMAL LOW (ref 8.9–10.3)
Calcium: 8.8 mg/dL — ABNORMAL LOW (ref 8.9–10.3)
Chloride: 102 mmol/L (ref 98–111)
Chloride: 107 mmol/L (ref 98–111)
Creatinine, Ser: 0.66 mg/dL (ref 0.44–1.00)
Creatinine, Ser: 0.69 mg/dL (ref 0.44–1.00)
GFR, Estimated: >60 mL/min (ref >60)
GFR, Estimated: >60 mL/min (ref >60)
Glucose, Bld: 109 mg/dL — ABNORMAL HIGH (ref 70–99)
Glucose, Bld: 107 mg/dL — ABNORMAL HIGH (ref 70–99)
Potassium: 3.6 mmol/L (ref 3.5–5.1)
Potassium: 3.7 mmol/L (ref 3.5–5.1)
Sodium: 136 mmol/L (ref 135–145)
Sodium: 139 mmol/L (ref 135–145)
Total Bilirubin: 1.3 mg/dL — ABNORMAL HIGH (ref 0.3–1.2)
Total Bilirubin: 1.4 mg/dL — ABNORMAL HIGH (ref 0.3–1.2)
Total Protein: 8.1 g/dL (ref 6.5–8.1)
Total Protein: 6.7 g/dL (ref 6.5–8.1)

## 2022-07-09 LAB — URINALYSIS, ROUTINE W REFLEX MICROSCOPIC
Bilirubin Urine: NEGATIVE
Glucose, UA: NEGATIVE mg/dL
Ketones, ur: NEGATIVE mg/dL
Nitrite: NEGATIVE
Protein, ur: NEGATIVE mg/dL
Specific Gravity, Urine: 1.01 (ref 1.005–1.030)
pH: 5.5 (ref 5.0–8.0)

## 2022-07-09 LAB — URINALYSIS, MICROSCOPIC (REFLEX)

## 2022-07-09 LAB — RESP PANEL BY RT-PCR (FLU A&B, COVID) ARPGX2
Influenza A by PCR: NEGATIVE
Influenza B by PCR: NEGATIVE
SARS Coronavirus 2 by RT PCR: NEGATIVE

## 2022-07-09 LAB — PROTIME-INR
INR: 1.1 (ref 0.8–1.2)
Prothrombin Time: 13.8 seconds (ref 11.4–15.2)

## 2022-07-09 LAB — LACTIC ACID, PLASMA: Lactic Acid, Venous: 1.1 mmol/L (ref 0.5–1.9)

## 2022-07-09 LAB — APTT: aPTT: 31 seconds (ref 24–36)

## 2022-07-09 LAB — LIPASE, BLOOD: Lipase: 30 U/L (ref 11–51)

## 2022-07-09 LAB — PREGNANCY, URINE: Preg Test, Ur: NEGATIVE

## 2022-07-09 MED ORDER — LACTATED RINGERS IV BOLUS (SEPSIS)
500.0000 mL | Freq: Once | INTRAVENOUS | Status: AC
  Administered 2022-07-09: 500 mL via INTRAVENOUS

## 2022-07-09 MED ORDER — LACTATED RINGERS IV SOLN: INTRAVENOUS | Status: AC

## 2022-07-09 MED ORDER — SODIUM CHLORIDE 0.9 % IV SOLN
1.0000 g | Freq: Every day | INTRAVENOUS | Status: DC
  Administered 2022-07-09 – 2022-07-10 (×2): 1 g via INTRAVENOUS
  Filled 2022-07-09 (×2): qty 10

## 2022-07-09 MED ORDER — ACETAMINOPHEN 650 MG RE SUPP: 650.0000 mg | Freq: Four times a day (QID) | RECTAL | Status: DC | PRN

## 2022-07-09 MED ORDER — VANCOMYCIN HCL IN DEXTROSE 1-5 GM/200ML-% IV SOLN
1000.0000 mg | Freq: Once | INTRAVENOUS | Status: AC
  Administered 2022-07-09: 1000 mg via INTRAVENOUS
  Filled 2022-07-09: qty 200

## 2022-07-09 MED ORDER — LACTATED RINGERS IV BOLUS (SEPSIS)
1000.0000 mL | Freq: Once | INTRAVENOUS | Status: AC
  Administered 2022-07-09: 1000 mL via INTRAVENOUS

## 2022-07-09 MED ORDER — LACTATED RINGERS IV BOLUS (SEPSIS)
250.0000 mL | Freq: Once | INTRAVENOUS | Status: AC
  Administered 2022-07-09: 250 mL via INTRAVENOUS

## 2022-07-09 MED ORDER — ONDANSETRON HCL 4 MG PO TABS: 4.0000 mg | ORAL_TABLET | Freq: Four times a day (QID) | ORAL | Status: DC | PRN

## 2022-07-09 MED ORDER — SODIUM CHLORIDE 0.9 % IV SOLN
2.0000 g | Freq: Once | INTRAVENOUS | Status: AC
  Administered 2022-07-09: 2 g via INTRAVENOUS
  Filled 2022-07-09 (×2): qty 12.5

## 2022-07-09 MED ORDER — ONDANSETRON HCL 4 MG/2ML IJ SOLN
4.0000 mg | Freq: Once | INTRAMUSCULAR | Status: AC
  Administered 2022-07-09: 4 mg via INTRAVENOUS
  Filled 2022-07-09: qty 2

## 2022-07-09 MED ORDER — ALUM & MAG HYDROXIDE-SIMETH 200-200-20 MG/5ML PO SUSP
30.0000 mL | Freq: Once | ORAL | Status: AC
  Administered 2022-07-09: 30 mL via ORAL
  Filled 2022-07-09: qty 30

## 2022-07-09 MED ORDER — ENOXAPARIN SODIUM 40 MG/0.4ML IJ SOSY
40.0000 mg | PREFILLED_SYRINGE | INTRAMUSCULAR | Status: DC
  Filled 2022-07-09: qty 0.4

## 2022-07-09 MED ORDER — ORAL CARE MOUTH RINSE: 15.0000 mL | OROMUCOSAL | Status: DC | PRN

## 2022-07-09 MED ORDER — METRONIDAZOLE 500 MG/100ML IV SOLN
500.0000 mg | Freq: Once | INTRAVENOUS | Status: AC
  Administered 2022-07-09: 500 mg via INTRAVENOUS
  Filled 2022-07-09: qty 100

## 2022-07-09 MED ORDER — MORPHINE SULFATE (PF) 4 MG/ML IV SOLN: 4.0000 mg | INTRAVENOUS | Status: DC | PRN

## 2022-07-09 MED ORDER — KETOROLAC TROMETHAMINE 30 MG/ML IJ SOLN
30.0000 mg | Freq: Once | INTRAMUSCULAR | Status: AC
  Administered 2022-07-09: 30 mg via INTRAVENOUS
  Filled 2022-07-09: qty 1

## 2022-07-09 MED ORDER — TRAZODONE HCL 100 MG PO TABS: 100.0000 mg | ORAL_TABLET | Freq: Every evening | ORAL | Status: DC | PRN

## 2022-07-09 MED ORDER — ONDANSETRON HCL 4 MG/2ML IJ SOLN: 4.0000 mg | Freq: Four times a day (QID) | INTRAMUSCULAR | Status: DC | PRN

## 2022-07-09 MED ORDER — ACETAMINOPHEN 325 MG PO TABS: 650.0000 mg | ORAL_TABLET | Freq: Four times a day (QID) | ORAL | Status: DC | PRN

## 2022-07-09 MED ORDER — BUSPIRONE HCL 5 MG PO TABS: 15.0000 mg | ORAL_TABLET | Freq: Three times a day (TID) | ORAL | Status: DC | PRN

## 2022-07-09 NOTE — ED Notes (Signed)
Report given to [facility] 

## 2022-07-09 NOTE — Sepsis Progress Note (Signed)
[facility] following code sepsis °

## 2022-07-09 NOTE — ED Provider Notes (Signed)
[facility] EMERGENCY DEPARTMENT Provider Note   CSN: [id number] Arrival date & time: 07/09/22  0112     History  Chief Complaint  Patient presents with   Abdominal Pain    Cindy Martinez is a 26 y.o. female.  The history is provided by the patient.  Abdominal Pain Pain location:  Suprapubic Pain quality: sharp   Pain quality comment:  Also reported LUQ pain Pain radiates to:  Does not radiate Pain severity:  Moderate Onset quality:  Gradual Timing:  Intermittent Progression:  Unchanged Chronicity:  New Context: not alcohol use   Relieved by:  Nothing Worsened by:  Nothing Associated symptoms: fever   Associated symptoms: no dysuria, no shortness of breath and no vomiting   Risk factors: not pregnant   Patient with HTN presents with shapr pains coming and going in the suprapubic area and fevers to > 102 for the last few days.       Past Medical History:  Diagnosis Date   Hypertension    Vaginal Pap smear, abnormal   \  Home Medications Prior to Admission medications   Medication Sig Start Date End Date Taking? Authorizing Provider  busPIRone (BUSPAR) 10 MG tablet Take 15 mg by mouth 3 (three) times daily.    [provider]  cholecalciferol (VITAMIN D3) 25 MCG (1000 UNIT) tablet Take 1,000 Units by mouth daily. Patient not taking: Reported on 01/24/2021    [provider]  diclofenac (CATAFLAM) 50 MG tablet Take 1 tablet (50 mg total) by mouth 3 (three) times daily. Patient not taking: Reported on 08/14/2021 08/08/20   Willodean Rosenthal, MD  doxycycline (VIBRAMYCIN) 100 MG capsule Take 1 capsule (100 mg total) by mouth 2 (two) times daily. One po bid x 7 days Patient not taking: Reported on 06/30/2020 03/19/20   Molpus, John, MD  escitalopram (LEXAPRO) 10 MG tablet Take 10 mg by mouth daily.    [provider]  fluconazole (DIFLUCAN) 150 MG tablet TAKE 1 TABLET BY MOUTH ONCE FOR 1 DOSE. CAN TAKE ADDITIONAL THREE DAYS LATER  IF SYMPTOMS PERSIST 01/31/21   Levie Heritage, DO  indapamide (LOZOL) 1.25 MG tablet Take 1.25 mg by mouth daily.  Patient not taking: Reported on 06/30/2020    [provider]  metroNIDAZOLE (FLAGYL) 500 MG tablet Take 1 tablet (500 mg total) by mouth 2 (two) times daily. Patient not taking: Reported on 06/30/2020 03/30/20   Willodean Rosenthal, MD  metroNIDAZOLE (FLAGYL) 500 MG tablet Take 1 tablet (500 mg total) by mouth 2 (two) times daily. 06/05/22   Levie Heritage, DO  Norethindrone Acetate-Ethinyl Estrad-FE (LOESTRIN 24 FE) 1-20 MG-MCG(24) tablet Take 1 tablet by mouth daily. Patient not taking: Reported on 06/30/2020 11/12/19   Willodean Rosenthal, MD  traZODone (DESYREL) 100 MG tablet Take 100 mg by mouth at bedtime.    [provider]  prochlorperazine (COMPAZINE) 10 MG tablet Take 1 tablet (10 mg total) by mouth every 6 (six) hours as needed for nausea or vomiting. Patient not taking: Reported on 10/23/2019 10/16/19 03/19/20  Dione Booze, MD      Allergies    Omnipaque [iohexol]    Review of Systems   Review of Systems  Constitutional:  Positive for fever.  Respiratory:  Negative for shortness of breath.   Gastrointestinal:  Positive for abdominal pain. Negative for vomiting.  Genitourinary:  Negative for dysuria and flank pain.  All other systems reviewed and are negative.   Physical Exam Updated Vital Signs BP  126/88 (BP Location: Right Arm)   Pulse (!) 101   Temp 98.3 F (36.8 C) (Oral)   Resp 20   Ht 5\' 3"  (1.6 m)   Wt 73.9 kg   LMP 06/21/2022   SpO2 100%   BMI 28.87 kg/m  Physical Exam Vitals and nursing note reviewed. Exam conducted with a chaperone present.  Constitutional:      General: She is not in acute distress.    Appearance: She is well-developed. She is not diaphoretic.  HENT:     Head: Normocephalic and atraumatic.     Nose: Nose normal.  Eyes:     Pupils: Pupils are equal, round, and reactive to light.   Cardiovascular:     Rate and Rhythm: Regular rhythm. Tachycardia present.     Pulses: Normal pulses.     Heart sounds: Normal heart sounds.  Pulmonary:     Effort: Pulmonary effort is normal. No respiratory distress.     Breath sounds: Normal breath sounds.  Abdominal:     General: Bowel sounds are normal. There is no distension.     Palpations: Abdomen is soft.     Tenderness: There is no abdominal tenderness. There is no guarding or rebound.  Genitourinary:    Vagina: No vaginal discharge.  Musculoskeletal:        General: Normal range of motion.     Cervical back: Neck supple.  Skin:    General: Skin is warm and dry.     Capillary Refill: Capillary refill takes less than 2 seconds.     Findings: No erythema or rash.  Neurological:     General: No focal deficit present.     Mental Status: She is alert and oriented to person, place, and time.     Deep Tendon Reflexes: Reflexes normal.  Psychiatric:        Mood and Affect: Mood normal.        Behavior: Behavior normal.     ED Results / Procedures / Treatments   Labs (all labs ordered are listed, but only abnormal results are displayed) Results for orders placed or performed during the hospital encounter of 07/09/22  Resp Panel by RT-PCR (Flu A&B, Covid) Anterior Nasal Swab   Specimen: Anterior Nasal Swab  Result Value Ref Range   SARS Coronavirus 2 by RT PCR NEGATIVE NEGATIVE   Influenza A by PCR NEGATIVE NEGATIVE   Influenza B by PCR NEGATIVE NEGATIVE  Lactic acid, plasma  Result Value Ref Range   Lactic Acid, Venous 1.1 0.5 - 1.9 mmol/L  Comprehensive metabolic panel  Result Value Ref Range   Sodium 136 135 - 145 mmol/L   Potassium 3.6 3.5 - 5.1 mmol/L   Chloride 102 98 - 111 mmol/L   CO2 25 22 - 32 mmol/L   Glucose, Bld 109 (H) 70 - 99 mg/dL   BUN 14 6 - 20 mg/dL   Creatinine, Ser 6.570.66 0.44 - 1.00 mg/dL   Calcium 8.7 (L) 8.9 - 10.3 mg/dL   Total Protein 8.1 6.5 - 8.1 g/dL   Albumin 4.4 3.5 - 5.0 g/dL   AST  31 15 - 41 U/L   ALT 36 0 - 44 U/L   Alkaline Phosphatase 85 38 - 126 U/L   Total Bilirubin 1.3 (H) 0.3 - 1.2 mg/dL   GFR, Estimated >84>60 >69>60 mL/min   Anion gap 9 5 - 15  CBC with Differential  Result Value Ref Range   WBC 12.9 (H) 4.0 - 10.5 K/uL  RBC 4.66 3.87 - 5.11 MIL/uL   Hemoglobin 13.5 12.0 - 15.0 g/dL   HCT 56.3 87.5 - 64.3 %   MCV 85.4 80.0 - 100.0 fL   MCH 29.0 26.0 - 34.0 pg   MCHC 33.9 30.0 - 36.0 g/dL   RDW 32.9 51.8 - 84.1 %   Platelets 350 150 - 400 K/uL   nRBC 0.0 0.0 - 0.2 %   Neutrophils Relative % 72 %   Neutro Abs 9.3 (H) 1.7 - 7.7 K/uL   Lymphocytes Relative 20 %   Lymphs Abs 2.6 0.7 - 4.0 K/uL   Monocytes Relative 7 %   Monocytes Absolute 0.9 0.1 - 1.0 K/uL   Eosinophils Relative 1 %   Eosinophils Absolute 0.1 0.0 - 0.5 K/uL   Basophils Relative 0 %   Basophils Absolute 0.1 0.0 - 0.1 K/uL   Immature Granulocytes 0 %   Abs Immature Granulocytes 0.04 0.00 - 0.07 K/uL  Protime-INR  Result Value Ref Range   Prothrombin Time 13.8 11.4 - 15.2 seconds   INR 1.1 0.8 - 1.2  APTT  Result Value Ref Range   aPTT 31 24 - 36 seconds  Urinalysis, Routine w reflex microscopic  Result Value Ref Range   Color, Urine YELLOW YELLOW   APPearance CLEAR CLEAR   Specific Gravity, Urine 1.010 1.005 - 1.030   pH 5.5 5.0 - 8.0   Glucose, UA NEGATIVE NEGATIVE mg/dL   Hgb urine dipstick TRACE (A) NEGATIVE   Bilirubin Urine NEGATIVE NEGATIVE   Ketones, ur NEGATIVE NEGATIVE mg/dL   Protein, ur NEGATIVE NEGATIVE mg/dL   Nitrite NEGATIVE NEGATIVE   Leukocytes,Ua TRACE (A) NEGATIVE  Pregnancy, urine  Result Value Ref Range   Preg Test, Ur NEGATIVE NEGATIVE  Lipase, blood  Result Value Ref Range   Lipase 30 11 - 51 U/L  Urinalysis, Microscopic (reflex)  Result Value Ref Range   RBC / HPF 0-5 0 - 5 RBC/hpf   WBC, UA 0-5 0 - 5 WBC/hpf   Bacteria, UA RARE (A) NONE SEEN   Squamous Epithelial / LPF 0-5 0 - 5   CT Renal Stone Study  Result Date: 07/09/2022 CLINICAL  DATA:  Left upper quadrant pain and fever. EXAM: CT ABDOMEN AND PELVIS WITHOUT CONTRAST TECHNIQUE: Multidetector CT imaging of the abdomen and pelvis was performed following the standard protocol without IV contrast. RADIATION DOSE REDUCTION: This exam was performed according to the departmental dose-optimization program which includes automated exposure control, adjustment of the mA and/or kV according to patient size and/or use of iterative reconstruction technique. COMPARISON:  CT without contrast 12/30/2021, CT with contrast 01/30/2020 FINDINGS: Lower chest: No acute findings. Hepatobiliary: The liver is 18 cm length with mild steatosis. No focal lesion is seen without contrast. The gallbladder and bile ducts are unremarkable. Pancreas: No focal abnormality is seen without contrast. No inflammatory change or ductal dilatation. Spleen: Unremarkable without contrast. Adrenals/Urinary Tract: There is no adrenal mass, no focal abnormality in the unenhanced renal cortex. 5 mm nonobstructive caliceal stone is again noted in the inferior pole of the left kidney and a few tiny punctate nonobstructing caliceal stones on the right. There is no hydronephrosis or ureteral stones. There is no intravesical stone or bladder thickening. Stomach/Bowel: No dilatation or wall thickening including of the appendix. Moderate stool retention ascending and transverse colon. Descending and sigmoid diverticulosis noted without evidence of diverticulitis. Vascular/Lymphatic: No significant vascular findings are present. No enlarged abdominal or pelvic lymph nodes. Reproductive: Uterus and bilateral  adnexa are unremarkable. Other: There is no free air, free fluid or free hemorrhage, no acute inflammatory changes or incarcerated hernias. Musculoskeletal: No acute or significant osseous findings. IMPRESSION: 1. No acute noncontrast CT findings. 2. Nonobstructive nephrolithiasis. 3. Mild hepatic steatosis. 4. Constipation and diverticulosis.  Electronically Signed   By: Almira Bar M.D.   On: 07/09/2022 02:51   DG Chest Port 1 View  Result Date: 07/09/2022 CLINICAL DATA:  Left upper quadrant pain. EXAM: PORTABLE CHEST 1 VIEW COMPARISON:  None Available. FINDINGS: The heart size and mediastinal contours are within normal limits. Both lungs are clear. The visualized skeletal structures are unremarkable. IMPRESSION: No active disease. Electronically Signed   By: Aram Candela M.D.   On: 07/09/2022 02:22    EKG  EKG Interpretation  Date/Time:  Monday July 09 2022 01:58:52 EST Ventricular Rate:  126 PR Interval:  125 QRS Duration: 87 QT Interval:  305 QTC Calculation: 442 R Axis:   86 Text Interpretation: Sinus tachycardia LAE, consider biatrial enlargement Confirmed by Yashica Sterbenz ([id number]) on 07/09/2022 6:49:13 AM        Radiology CT Renal Stone Study  Result Date: 07/09/2022 CLINICAL DATA:  Left upper quadrant pain and fever. EXAM: CT ABDOMEN AND PELVIS WITHOUT CONTRAST TECHNIQUE: Multidetector CT imaging of the abdomen and pelvis was performed following the standard protocol without IV contrast. RADIATION DOSE REDUCTION: This exam was performed according to the departmental dose-optimization program which includes automated exposure control, adjustment of the mA and/or kV according to patient size and/or use of iterative reconstruction technique. COMPARISON:  CT without contrast 12/30/2021, CT with contrast 01/30/2020 FINDINGS: Lower chest: No acute findings. Hepatobiliary: The liver is 18 cm length with mild steatosis. No focal lesion is seen without contrast. The gallbladder and bile ducts are unremarkable. Pancreas: No focal abnormality is seen without contrast. No inflammatory change or ductal dilatation. Spleen: Unremarkable without contrast. Adrenals/Urinary Tract: There is no adrenal mass, no focal abnormality in the unenhanced renal cortex. 5 mm nonobstructive caliceal stone is again noted in the inferior pole  of the left kidney and a few tiny punctate nonobstructing caliceal stones on the right. There is no hydronephrosis or ureteral stones. There is no intravesical stone or bladder thickening. Stomach/Bowel: No dilatation or wall thickening including of the appendix. Moderate stool retention ascending and transverse colon. Descending and sigmoid diverticulosis noted without evidence of diverticulitis. Vascular/Lymphatic: No significant vascular findings are present. No enlarged abdominal or pelvic lymph nodes. Reproductive: Uterus and bilateral adnexa are unremarkable. Other: There is no free air, free fluid or free hemorrhage, no acute inflammatory changes or incarcerated hernias. Musculoskeletal: No acute or significant osseous findings. IMPRESSION: 1. No acute noncontrast CT findings. 2. Nonobstructive nephrolithiasis. 3. Mild hepatic steatosis. 4. Constipation and diverticulosis. Electronically Signed   By: Almira Bar M.D.   On: 07/09/2022 02:51   DG Chest Port 1 View  Result Date: 07/09/2022 CLINICAL DATA:  Left upper quadrant pain. EXAM: PORTABLE CHEST 1 VIEW COMPARISON:  None Available. FINDINGS: The heart size and mediastinal contours are within normal limits. Both lungs are clear. The visualized skeletal structures are unremarkable. IMPRESSION: No active disease. Electronically Signed   By: Aram Candela M.D.   On: 07/09/2022 02:22    Procedures Procedures    Medications Ordered in ED Medications  lactated ringers infusion (0 mLs Intravenous Stopped 07/09/22 0510)  ceFEPIme (MAXIPIME) 2 g in sodium chloride 0.9 % 100 mL IVPB (0 g Intravenous Stopped 07/09/22 0427)  metroNIDAZOLE (FLAGYL) IVPB 500  mg (0 mg Intravenous Stopped 07/09/22 0427)  vancomycin (VANCOCIN) IVPB 1000 mg/200 mL premix (0 mg Intravenous Stopped 07/09/22 0427)  lactated ringers bolus 1,000 mL (0 mLs Intravenous Stopped 07/09/22 0509)    And  lactated ringers bolus 500 mL (0 mLs Intravenous Stopped 07/09/22 0509)    And   lactated ringers bolus 250 mL (0 mLs Intravenous Stopped 07/09/22 0509)  ondansetron (ZOFRAN) injection 4 mg (4 mg Intravenous Given 07/09/22 0417)  alum & mag hydroxide-simeth (MAALOX/MYLANTA) 200-200-20 MG/5ML suspension 30 mL (30 mLs Oral Given 07/09/22 0417)  ketorolac (TORADOL) 30 MG/ML injection 30 mg (30 mg Intravenous Given 07/09/22 0418)    ED Course/ Medical Decision Making/ A&P                           Medical Decision Making Patient with HTN with fevers and abdominal pain   Amount and/or Complexity of Data Reviewed External Data Reviewed: notes.    Details: Previous notes reviewed  Labs: ordered.    Details: All labs reviewed: covid and flu are negative.  Normal lactate 1.1, white count elevated 12.9, normal hemoglobin and platelet count.  Urine with mild UTI.  Lipase normal 30.  Sodium is normal 136, normal potassium 3.6, normal creatinine .36  Radiology: ordered and independent interpretation performed.    Details: No stones in the ureters on CT ECG/medicine tests: ordered and independent interpretation performed. Decision-making details documented in ED Course. Discussion of management or test interpretation with external provider(s): Admitted to Dr. Nevada Crane   Risk OTC drugs. Prescription drug management. Decision regarding hospitalization.  Critical Care Total time providing critical care: 60 minutes (Sepsis bundle with multiple boluses and antibiotics )   CRITICAL CARE Performed by: Kenneith Stief K Kasen Adduci-Rasch Total critical care time: 60 minutes Critical care time was exclusive of separately billable procedures and treating other patients. Critical care was necessary to treat or prevent imminent or life-threatening deterioration. Critical care was time spent personally by me on the following activities: development of treatment plan with patient and/or surrogate as well as nursing, discussions with consultants, evaluation of patient's response to treatment, examination of  patient, obtaining history from patient or surrogate, ordering and performing treatments and interventions, ordering and review of laboratory studies, ordering and review of radiographic studies, pulse oximetry and re-evaluation of patient's condition.  Final Clinical Impression(s) / ED Diagnoses Final diagnoses:  SIRS (systemic inflammatory response syndrome) ([place])   The patient appears reasonably stabilized for admission considering the current resources, flow, and capabilities available in the ED at this time, and I doubt any other [facility] requiring further screening and/or treatment in the ED prior to admission.  Rx / DC Orders ED Discharge Orders     None         Buckley Bradly, MD 07/09/22 [id number]

## 2022-07-09 NOTE — ED Triage Notes (Signed)
Reports LUQ pain that radiates to back. Also reports fever, tmax 102+. Denies urinary sx. Reports hx of kidney stones often. She states this pain feels like when she is trying to pass a stone, but it isnt resolving. HR 145 in triage.

## 2022-07-09 NOTE — H&P (Signed)
History and Physical    Patient: Cindy Martinez OVZ:[id number] DOB: 1996-06-25 DOA: 07/09/2022 DOS: the patient was seen and examined on 07/09/2022 PCP: Pcp, No  Patient coming from: Home  Chief Complaint:  Chief Complaint  Patient presents with   Abdominal Pain   HPI: Cindy Martinez is a 26 y.o. female with medical history significant of hypertension, normal Pap smear, nephrolithiasis who presented to the emergency with left upper quadrant pain that radiates to her back and fever of 102 F at home.  The patient stated that she has had cough mostly nonproductive, sore throat, decreased appetite, generalized body aches and malaise since Thursday.  Her RUQ pain does not feel like her nephrolithiasis.  She has had these RUQ pain on and off since earlier in the year.  She has noticed that he happens sometimes when she is drinking fluids, but does not associate any particular foods with it.  She also mentioned that sometimes she feels an unusual pressure-like pain in her right shoulder. No diarrhea, constipation, melena or hematochezia.  No  dysuria, frequency or hematuria. No dyspnea, wheezing or hemoptysis.  No chest pain, palpitations, diaphoresis, PND, orthopnea or pitting edema of the lower extremities. No polyuria, polydipsia, polyphagia or blurred vision.  ED course: Initial vital signs were temperature 99.7 F, pulse 145, respiration 20, BP 151/89 mmHg O2 sat 100% on room air.  The patient received 1750 mL of LR bolus, 30 mL of Maalox p.o., cefepime, metronidazole, vancomycin IVPB and 4 mg of ondansetron IVP.  Lab work: Her urinalysis showed trace hemoglobinuria, trace leukocyte esterase 0-5 WBC and rare bacteria microscopic examination.  CBC showed a white count 12.9, hemoglobin 13.5 g/dL platelets 350.  PT 13.8 INR 1.1 PTT 31.  Lactic acid was normal.  Pregnancy test was negative.  Coronavirus and influenza PCR unremarkable.  CMP with a glucose of 109, calcium 8.7 and total bilirubin 1.3 mg/dL.   The rest of the CMP measurements were normal.  Lipase was 30.  Imaging: Portable 1 view chest radiograph with no active disease.  CT renal protocol with no acute noncontrast CT findings.  There was nonobstructing nephrolithiasis as a calyceal stone was seen again in the inferior pole of the left kidney and a few tiny punctate nonobstructing calyceal stones on the right.  There is no hydronephrosis or ureteral stone.  No intravesical stone or bladder thickening.  There is mild hepatic asteatosis.  Positive constipation and diverticulosis.   Review of Systems: As mentioned in the history of present illness. All other systems reviewed and are negative.  Past Medical History:  Diagnosis Date   Hypertension    Vaginal Pap smear, abnormal    Past Surgical History:  Procedure Laterality Date   CARPAL TUNNEL RELEASE     LEEP N/A    TONSILLECTOMY     Social History:  reports that she has never smoked. She has never used smokeless tobacco. She reports current alcohol use. She reports that she does not use drugs.  Allergies  Allergen Reactions   Omnipaque [Iohexol] Other (See Comments)    Pt developed sneezing and congestion post IV contrast injection, suggest premeds prior to future administration of contrast. NO airway compromise/hives/itching/swelling of tongue or throat    History reviewed. No pertinent family history.  Prior to Admission medications   Medication Sig Start Date End Date Taking? Authorizing Provider  busPIRone (BUSPAR) 10 MG tablet Take 15 mg by mouth 3 (three) times daily.    [provider]  cholecalciferol (VITAMIN  D3) 25 MCG (1000 UNIT) tablet Take 1,000 Units by mouth daily. Patient not taking: Reported on 01/24/2021    [provider]  diclofenac (CATAFLAM) 50 MG tablet Take 1 tablet (50 mg total) by mouth 3 (three) times daily. Patient not taking: Reported on 08/14/2021 08/08/20   Lavonia Drafts, MD  doxycycline (VIBRAMYCIN) 100 MG capsule  Take 1 capsule (100 mg total) by mouth 2 (two) times daily. One po bid x 7 days Patient not taking: Reported on 06/30/2020 03/19/20   Molpus, John, MD  escitalopram (LEXAPRO) 10 MG tablet Take 10 mg by mouth daily.    [provider]  fluconazole (DIFLUCAN) 150 MG tablet TAKE 1 TABLET BY MOUTH ONCE FOR 1 DOSE. CAN TAKE ADDITIONAL THREE DAYS LATER IF SYMPTOMS PERSIST 01/31/21   Truett Mainland, DO  indapamide (LOZOL) 1.25 MG tablet Take 1.25 mg by mouth daily.  Patient not taking: Reported on 06/30/2020    [provider]  metroNIDAZOLE (FLAGYL) 500 MG tablet Take 1 tablet (500 mg total) by mouth 2 (two) times daily. Patient not taking: Reported on 06/30/2020 03/30/20   Lavonia Drafts, MD  metroNIDAZOLE (FLAGYL) 500 MG tablet Take 1 tablet (500 mg total) by mouth 2 (two) times daily. 06/05/22   Truett Mainland, DO  Norethindrone Acetate-Ethinyl Estrad-FE (LOESTRIN 24 FE) 1-20 MG-MCG(24) tablet Take 1 tablet by mouth daily. Patient not taking: Reported on 06/30/2020 11/12/19   Lavonia Drafts, MD  traZODone (DESYREL) 100 MG tablet Take 100 mg by mouth at bedtime.    [provider]  prochlorperazine (COMPAZINE) 10 MG tablet Take 1 tablet (10 mg total) by mouth every 6 (six) hours as needed for nausea or vomiting. Patient not taking: Reported on 10/23/2019 [id number]  Delora Fuel, MD    Physical Exam: Vitals:   07/09/22 0800 07/09/22 0840 07/09/22 0859 07/09/22 1035  BP: 133/81   (!) 129/92  Pulse: (!) 108 100  98  Resp: 20   18  Temp:   98.5 F (36.9 C) 98.7 F (37.1 C)  TempSrc:   Oral   SpO2: 100%   97%  Weight:      Height:       Physical Exam Vitals and nursing note reviewed.  Constitutional:      Appearance: She is well-developed.  HENT:     Head: Normocephalic and atraumatic.     Nose: No rhinorrhea.     Mouth/Throat:     Mouth: Mucous membranes are dry.  Eyes:     General: No scleral icterus.    Pupils: Pupils are equal,  round, and reactive to light.  Neck:     Vascular: No JVD.  Cardiovascular:     Rate and Rhythm: Normal rate and regular rhythm.     Heart sounds: S1 normal and S2 normal.  Pulmonary:     Effort: Pulmonary effort is normal.     Breath sounds: Normal breath sounds. No wheezing, rhonchi or rales.  Abdominal:     General: Bowel sounds are normal.     Palpations: Abdomen is soft. There is no hepatomegaly or splenomegaly.     Tenderness: There is abdominal tenderness in the right upper quadrant and epigastric area. There is right CVA tenderness. There is no guarding or rebound.  Musculoskeletal:     Cervical back: Neck supple.     Right lower leg: No edema.     Left lower leg: No edema.  Skin:    General: Skin is warm and dry.  Neurological:     General: No focal deficit present.     Mental Status: She is alert and oriented to person, place, and time.  Psychiatric:        Mood and Affect: Mood normal.   Data Reviewed:  Results are pending, will review when available.  Assessment and Plan: Principal Problem:   SIRS (systemic inflammatory response syndrome) (HCC) Likely due to respiratory infection. The patient has had pain episodes with no other symptoms before. Admit to telemetry/inpatient. Continue ceftriaxone 1 g IVPB daily. Follow-up blood culture and sensitivity. Follow-up CBC and chemistry in the morning.  Active Problems:   RUQ pain Check ultrasound of the right upper quadrant. Analgesics and antiemetics as needed.    Hypertension Currently not on antihypertensives. Antihypertensives as needed. Follow-up closely with PCP. May benefit from lifestyle modifications.    Hypocalcemia On 2 different measurements. Check vitamin D level in the morning.    Advance Care Planning:   Code Status: Full Code   Consults:   Family Communication:   Severity of Illness: The appropriate patient status for this patient is OBSERVATION. Observation status is judged to be  reasonable and necessary in order to provide the required intensity of service to ensure the patient's safety. The patient's presenting symptoms, physical exam findings, and initial radiographic and laboratory data in the context of their medical condition is felt to place them at decreased risk for further clinical deterioration. Furthermore, it is anticipated that the patient will be medically stable for discharge from the hospital within 2 midnights of admission.   Author: Reubin Milan, MD 07/09/2022 10:42 AM  For on call review [email or web address].   This document was prepared using Dragon voice recognition software and may contain some unintended transcription errors.

## 2022-07-10 DIAGNOSIS — R6510 Systemic inflammatory response syndrome (SIRS) of non-infectious origin without acute organ dysfunction: Secondary | ICD-10-CM

## 2022-07-10 DIAGNOSIS — R1011 Right upper quadrant pain: Secondary | ICD-10-CM | POA: Diagnosis not present

## 2022-07-10 DIAGNOSIS — J069 Acute upper respiratory infection, unspecified: Secondary | ICD-10-CM

## 2022-07-10 DIAGNOSIS — I10 Essential (primary) hypertension: Secondary | ICD-10-CM | POA: Diagnosis not present

## 2022-07-10 LAB — VITAMIN D 25 HYDROXY (VIT D DEFICIENCY, FRACTURES): Vit D, 25-Hydroxy: 25.73 ng/mL — ABNORMAL LOW (ref 30–100)

## 2022-07-10 LAB — URINE CULTURE
Culture: NO GROWTH
Special Requests: NORMAL

## 2022-07-10 LAB — COMPREHENSIVE METABOLIC PANEL WITH GFR
ALT: 25 U/L (ref 0–44)
AST: 18 U/L (ref 15–41)
Albumin: 3.5 g/dL (ref 3.5–5.0)
Alkaline Phosphatase: 67 U/L (ref 38–126)
Anion gap: 3 — ABNORMAL LOW (ref 5–15)
BUN: 9 mg/dL (ref 6–20)
CO2: 27 mmol/L (ref 22–32)
Calcium: 8.5 mg/dL — ABNORMAL LOW (ref 8.9–10.3)
Chloride: 109 mmol/L (ref 98–111)
Creatinine, Ser: 0.68 mg/dL (ref 0.44–1.00)
GFR, Estimated: >60 mL/min (ref >60)
Glucose, Bld: 101 mg/dL — ABNORMAL HIGH (ref 70–99)
Potassium: 3.3 mmol/L — ABNORMAL LOW (ref 3.5–5.1)
Sodium: 139 mmol/L (ref 135–145)
Total Bilirubin: 0.9 mg/dL (ref 0.3–1.2)
Total Protein: 6.5 g/dL (ref 6.5–8.1)

## 2022-07-10 LAB — CBC
HCT: 36.5 % (ref 36.0–46.0)
Hemoglobin: 12.1 g/dL (ref 12.0–15.0)
MCH: 29 pg (ref 26.0–34.0)
MCHC: 33.2 g/dL (ref 30.0–36.0)
MCV: 87.5 fL (ref 80.0–100.0)
Platelets: 302 K/uL (ref 150–400)
RBC: 4.17 MIL/uL (ref 3.87–5.11)
RDW: 12.5 % (ref 11.5–15.5)
WBC: 8 K/uL (ref 4.0–10.5)
nRBC: 0 % (ref 0.0–0.2)

## 2022-07-10 MED ORDER — POTASSIUM CHLORIDE CRYS ER 20 MEQ PO TBCR
40.0000 meq | EXTENDED_RELEASE_TABLET | Freq: Once | ORAL | Status: AC
  Administered 2022-07-10: 40 meq via ORAL
  Filled 2022-07-10: qty 2

## 2022-07-10 NOTE — Hospital Course (Signed)
Cindy Martinez is a 26 y.o. female with medical history significant of hypertension, normal Pap smear, nephrolithiasis who presented to the emergency with left upper quadrant pain that radiates to her back and fever of 102 F at home.  The patient stated that she has had cough mostly nonproductive, sore throat, decreased appetite, generalized body aches and malaise since Thursday.  Her RUQ pain does not feel like her nephrolithiasis.  She has had these RUQ pain on and off since earlier in the year.  She has noticed that he happens sometimes when she is drinking fluids, but does not associate any particular foods with it.  She also mentioned that sometimes she feels an unusual pressure-like pain in her right shoulder. No diarrhea, constipation, melena or hematochezia.  No  dysuria, frequency or hematuria. No dyspnea, wheezing or hemoptysis.  No chest pain, palpitations, diaphoresis, PND, orthopnea or pitting edema of the lower extremities. No polyuria, polydipsia, polyphagia or blurred vision.   ED course: Initial vital signs were temperature 99.7 F, pulse 145, respiration 20, BP 151/89 mmHg O2 sat 100% on room air.  The patient received 1750 mL of LR bolus, 30 mL of Maalox p.o., cefepime, metronidazole, vancomycin IVPB and 4 mg of ondansetron IVP.   Lab work: Her urinalysis showed trace hemoglobinuria, trace leukocyte esterase 0-5 WBC and rare bacteria microscopic examination.  CBC showed a white count 12.9, hemoglobin 13.5 g/dL platelets 350.  PT 13.8 INR 1.1 PTT 31.  Lactic acid was normal.  Pregnancy test was negative.  Coronavirus and influenza PCR unremarkable.  CMP with a glucose of 109, calcium 8.7 and total bilirubin 1.3 mg/dL.  The rest of the CMP measurements were normal.  Lipase was 30.   Imaging: Portable 1 view chest radiograph with no active disease.  CT renal protocol with no acute noncontrast CT findings.  There was nonobstructing nephrolithiasis as a calyceal stone was seen again in the  inferior pole of the left kidney and a few tiny punctate nonobstructing calyceal stones on the right.  There is no hydronephrosis or ureteral stone.  No intravesical stone or bladder thickening.  There is mild hepatic asteatosis.  Positive constipation and diverticulosis.   Assessment and Plan: Principal Problem:   SIRS (systemic inflammatory response syndrome) (HCC) Likely due to respiratory infection. The patient has had pain episodes with no other symptoms before. Admit to telemetry/inpatient. Continue ceftriaxone 1 g IVPB daily. Follow-up blood culture and sensitivity. Follow-up CBC and chemistry in the morning.   Active Problems:   RUQ pain Check ultrasound of the right upper quadrant. Analgesics and antiemetics as needed.     Hypertension Currently not on antihypertensives. Antihypertensives as needed. Follow-up closely with PCP. May benefit from lifestyle modifications.     Hypocalcemia On 2 different measurements. Check vitamin D level in the morning.

## 2022-07-10 NOTE — TOC Transition Note (Signed)
Transition of Care [facility]) - CM/SW Discharge Note   Patient Details  Name: Cindy Martinez MRN: [id number] Date of Birth: December 07, 1995  Transition of Care [facility]) CM/SW Contact:  Leeroy Cha, RN Phone Number: 07/10/2022, 12:12 PM   Clinical Narrative:    [id number]/patient discharged to return home.  Chart reviewed for TOC needs.  None found.  Patient self care.   Final next level of care: Home/Self Care Barriers to Discharge: Barriers Resolved   Patient Goals and CMS Choice Patient states their goals for this hospitalization and ongoing recovery are:: to go back home CMS Medicare.gov Compare Post Acute Care list provided to:: Patient    Discharge Placement                       Discharge Plan and Services   Discharge Planning Services: CM Consult                                 Social Determinants of Health (SDOH) Interventions     Readmission Risk Interventions   No data to display

## 2022-07-10 NOTE — TOC Initial Note (Signed)
Transition of Care [facility]) - Initial/Assessment Note    Patient Details  Name: Cindy Martinez MRN: [id number] Date of Birth: 06-13-1996  Transition of Care [facility]) CM/SW Contact:    Leeroy Cha, RN Phone Number: 07/10/2022, 8:31 AM  Clinical Narrative:                  Transition of Care [facility]) Screening Note   Patient Details  Name: Cindy Martinez Date of Birth: 06/13/1996   Transition of Care [facility]) CM/SW Contact:    Leeroy Cha, RN Phone Number: 07/10/2022, 8:31 AM    Transition of Care [facility]) has reviewed patient and no TOC needs have been identified at this time. We will continue to monitor patient advancement through interdisciplinary progression rounds. If new patient transition needs arise, please place a TOC consult.    Expected Discharge Plan: Home/Self Care Barriers to Discharge: Continued Medical Work up   Patient Goals and CMS Choice Patient states their goals for this hospitalization and ongoing recovery are:: to go back home CMS Medicare.gov Compare Post Acute Care list provided to:: Patient    Expected Discharge Plan and Services Expected Discharge Plan: Home/Self Care   Discharge Planning Services: CM Consult   Living arrangements for the past 2 months: Apartment                                      Prior Living Arrangements/Services Living arrangements for the past 2 months: Apartment Lives with:: Self   Do you feel safe going back to the place where you live?: Yes               Activities of Daily Living Home Assistive Devices/Equipment: None ADL Screening (condition at time of admission) Patient's cognitive ability adequate to safely complete daily activities?: Yes Is the patient deaf or have difficulty hearing?: No Does the patient have difficulty seeing, even when wearing glasses/contacts?: No Does the patient have difficulty concentrating, remembering, or making decisions?: No Patient able to express need  for assistance with ADLs?: Yes Does the patient have difficulty dressing or bathing?: No Independently performs ADLs?: Yes (appropriate for developmental age) Does the patient have difficulty walking or climbing stairs?: No Weakness of Legs: None Weakness of Arms/Hands: None  Permission Sought/Granted                  Emotional Assessment Appearance:: Appears stated age Attitude/Demeanor/Rapport: Engaged Affect (typically observed): Calm Orientation: : Oriented to Self, Oriented to Place, Oriented to  Time, Oriented to Situation Alcohol / Substance Use: Alcohol Use (occasional use) Psych Involvement: No (comment)  Admission diagnosis:  SIRS (systemic inflammatory response syndrome) ([place]) [R65.10] Acute cystitis without hematuria [N30.00] Patient Active Problem List   Diagnosis Date Noted   SIRS (systemic inflammatory response syndrome) ([place]) 07/09/2022   RUQ pain 07/09/2022   Hypertension 07/09/2022   Hypocalcemia 07/09/2022   Bilateral wrist pain 09/23/2019   PCP:  Pcp, No Pharmacy:   [id number] [address] [place] [address] [place] [address] Phone: [phone] Fax: [phone]     Social Determinants of Health (SDOH) Interventions    Readmission Risk Interventions   No data to display

## 2022-07-10 NOTE — Discharge Summary (Signed)
Physician Discharge Summary   Patient: Cindy Martinez MRN: [id number] DOB: June 11, 1996  Admit date:     07/09/2022  Discharge date: 07/10/22  Discharge Physician: Kendell Bane   PCP: Pcp, No   Recommendations at discharge:  Follow-up with a PCP within 1 week  Discharge Diagnoses: Principal Problem:   SIRS (systemic inflammatory response syndrome) (HCC) Active Problems:   RUQ pain   Hypertension   Hypocalcemia  Resolved Problems:   * No resolved hospital problems. *  Hospital Course: Cindy Martinez is a 26 y.o. female with medical history significant of hypertension, normal Pap smear, nephrolithiasis who presented to the emergency with left upper quadrant pain that radiates to her back and fever of 102 F at home.  The patient stated that she has had cough mostly nonproductive, sore throat, decreased appetite, generalized body aches and malaise since Thursday.  Her RUQ pain does not feel like her nephrolithiasis.  She has had these RUQ pain on and off since earlier in the year.  She has noticed that he happens sometimes when she is drinking fluids, but does not associate any particular foods with it.  She also mentioned that sometimes she feels an unusual pressure-like pain in her right shoulder. No diarrhea, constipation, melena or hematochezia.  No  dysuria, frequency or hematuria. No dyspnea, wheezing or hemoptysis.  No chest pain, palpitations, diaphoresis, PND, orthopnea or pitting edema of the lower extremities. No polyuria, polydipsia, polyphagia or blurred vision.   ED course: Initial vital signs were temperature 99.7 F, pulse 145, respiration 20, BP 151/89 mmHg O2 sat 100% on room air.  The patient received 1750 mL of LR bolus, 30 mL of Maalox p.o., cefepime, metronidazole, vancomycin IVPB and 4 mg of ondansetron IVP.   Lab work: Her urinalysis showed trace hemoglobinuria, trace leukocyte esterase 0-5 WBC and rare bacteria microscopic examination.  CBC showed a white count 12.9,  hemoglobin 13.5 g/dL platelets 409.  PT 13.8 INR 1.1 PTT 31.  Lactic acid was normal.  Pregnancy test was negative.  Coronavirus and influenza PCR unremarkable.  CMP with a glucose of 109, calcium 8.7 and total bilirubin 1.3 mg/dL.  The rest of the CMP measurements were normal.  Lipase was 30.   Imaging: Portable 1 view chest radiograph with no active disease.  CT renal protocol with no acute noncontrast CT findings.  There was nonobstructing nephrolithiasis as a calyceal stone was seen again in the inferior pole of the left kidney and a few tiny punctate nonobstructing calyceal stones on the right.  There is no hydronephrosis or ureteral stone.  No intravesical stone or bladder thickening.  There is mild hepatic asteatosis.  Positive constipation and diverticulosis.   Assessment and Plan: Principal Problem:   SIRS (systemic inflammatory response syndrome) (HCC) Likely due to respiratory infection..  Seems to be more likely viral than bacterial Sepsis physiology resolved Status post treatment with Abrol at [facility] antibiotics Rocephin -On antibiotics were discontinued Symptoms has resolved, cultures negative to date Afebrile, normotensive, normal CBC and CMP    Active Problems:   RUQ pain -Resolved ultrasound of the right upper quadrant..  Analgesics and antiemetics as needed.     Hypertension Currently not on antihypertensives. Antihypertensives as needed. Follow-up closely with PCP. May benefit from lifestyle modifications.     Hypocalcemia On 2 different measurements. Check vitamin D level in the morning.    Procedures performed: CT of abdomen, ultrasound of abdomen, Disposition: Home Diet recommendation:  Discharge Diet Orders (From admission, onward)  Start     Ordered   07/10/22 0000  Diet - low sodium heart healthy        07/10/22 1152           Cardiac diet DISCHARGE MEDICATION: Allergies as of 07/10/2022       Reactions   Omnipaque [iohexol] Other (See  Comments)   Pt developed sneezing and congestion post IV contrast injection, suggest premeds prior to future administration of contrast. NO airway compromise/hives/itching/swelling of tongue or throat        Medication List     STOP taking these medications    metroNIDAZOLE 500 MG tablet Commonly known as: FLAGYL       TAKE these medications    acetaminophen 500 MG tablet Commonly known as: TYLENOL Take 1,000 mg by mouth every 6 (six) hours as needed for mild pain.   busPIRone 15 MG tablet Commonly known as: BUSPAR Take 15 mg by mouth 3 (three) times daily as needed (anxiety).   celecoxib 200 MG capsule Commonly known as: CELEBREX Take 200 mg by mouth daily.   escitalopram 10 MG tablet Commonly known as: LEXAPRO Take 10 mg by mouth daily as needed (anxiety).   traZODone 100 MG tablet Commonly known as: DESYREL Take 100 mg by mouth at bedtime as needed for sleep.        Discharge Exam: Filed Weights   07/09/22 0137  Weight: 73.9 kg      Physical Exam:   General:  AAO x 3,  cooperative, no distress;   HEENT:  Normocephalic, PERRL, otherwise with in Normal limits   Neuro:  CNII-XII intact. , normal motor and sensation, reflexes intact   Lungs:   Clear to auscultation BL, Respirations unlabored,  No wheezes / crackles  Cardio:    S1/S2, RRR, No murmure, No Rubs or Gallops   Abdomen:  Soft, non-tender, bowel sounds active all four quadrants, no guarding or peritoneal signs.  Muscular  skeletal:  Limited exam -global generalized weaknesses - in bed, able to move all 4 extremities,   2+ pulses,  symmetric, No pitting edema  Skin:  Dry, warm to touch, negative for any Rashes,  Wounds: Please see nursing documentation          Condition at discharge: good  The results of significant diagnostics from this hospitalization (including imaging, microbiology, ancillary and laboratory) are listed below for reference.   Imaging Studies: US Abdomen Limited  RUQ (LIVER/GB)  Result Date: 07/09/2022 CLINICAL DATA:  Right upper quadrant pain x1 week. EXAM: ULTRASOUND ABDOMEN LIMITED RIGHT UPPER QUADRANT COMPARISON:  None Available. FINDINGS: Gallbladder: No gallstones or wall thickening visualized (2.6 mm). No sonographic Murphy sign noted by sonographer. Common bile duct: Diameter: 2.4 mm Liver: No focal lesion identified. Within normal limits in parenchymal echogenicity. Portal vein is patent on color Doppler imaging with normal direction of blood flow towards the liver. Other: None. IMPRESSION: Normal right upper quadrant ultrasound. Electronically Signed   By: Aram Candela M.D.   On: 07/09/2022 19:47   CT Renal Stone Study  Result Date: 07/09/2022 CLINICAL DATA:  Left upper quadrant pain and fever. EXAM: CT ABDOMEN AND PELVIS WITHOUT CONTRAST TECHNIQUE: Multidetector CT imaging of the abdomen and pelvis was performed following the standard protocol without IV contrast. RADIATION DOSE REDUCTION: This exam was performed according to the departmental dose-optimization program which includes automated exposure control, adjustment of the mA and/or kV according to patient size and/or use of iterative reconstruction technique. COMPARISON:  CT without contrast  12/30/2021, CT with contrast 01/30/2020 FINDINGS: Lower chest: No acute findings. Hepatobiliary: The liver is 18 cm length with mild steatosis. No focal lesion is seen without contrast. The gallbladder and bile ducts are unremarkable. Pancreas: No focal abnormality is seen without contrast. No inflammatory change or ductal dilatation. Spleen: Unremarkable without contrast. Adrenals/Urinary Tract: There is no adrenal mass, no focal abnormality in the unenhanced renal cortex. 5 mm nonobstructive caliceal stone is again noted in the inferior pole of the left kidney and a few tiny punctate nonobstructing caliceal stones on the right. There is no hydronephrosis or ureteral stones. There is no intravesical stone or  bladder thickening. Stomach/Bowel: No dilatation or wall thickening including of the appendix. Moderate stool retention ascending and transverse colon. Descending and sigmoid diverticulosis noted without evidence of diverticulitis. Vascular/Lymphatic: No significant vascular findings are present. No enlarged abdominal or pelvic lymph nodes. Reproductive: Uterus and bilateral adnexa are unremarkable. Other: There is no free air, free fluid or free hemorrhage, no acute inflammatory changes or incarcerated hernias. Musculoskeletal: No acute or significant osseous findings. IMPRESSION: 1. No acute noncontrast CT findings. 2. Nonobstructive nephrolithiasis. 3. Mild hepatic steatosis. 4. Constipation and diverticulosis. Electronically Signed   By: Telford Nab M.D.   On: 07/09/2022 02:51   DG Chest Port 1 View  Result Date: 07/09/2022 CLINICAL DATA:  Left upper quadrant pain. EXAM: PORTABLE CHEST 1 VIEW COMPARISON:  None Available. FINDINGS: The heart size and mediastinal contours are within normal limits. Both lungs are clear. The visualized skeletal structures are unremarkable. IMPRESSION: No active disease. Electronically Signed   By: Virgina Norfolk M.D.   On: 07/09/2022 02:22    Microbiology: Results for orders placed or performed during the hospital encounter of 07/09/22  Resp Panel by RT-PCR (Flu A&B, Covid) Anterior Nasal Swab     Status: None   Collection Time: 07/09/22  1:58 AM   Specimen: Anterior Nasal Swab  Result Value Ref Range Status   SARS Coronavirus 2 by RT PCR NEGATIVE NEGATIVE Final    Comment: (NOTE) SARS-CoV-2 target nucleic acids are NOT DETECTED.  The SARS-CoV-2 RNA is generally detectable in upper respiratory specimens during the acute phase of infection. The lowest concentration of SARS-CoV-2 viral copies this assay can detect is 138 copies/mL. A negative result does not preclude SARS-Cov-2 infection and should not be used as the sole basis for treatment or other patient  management decisions. A negative result may occur with  improper specimen collection/handling, submission of specimen other than nasopharyngeal swab, presence of viral mutation(s) within the areas targeted by this assay, and inadequate number of viral copies(<138 copies/mL). A negative result must be combined with clinical observations, patient history, and epidemiological information. The expected result is Negative.  Fact Sheet for Patients:  EntrepreneurPulse.com.au  Fact Sheet for Healthcare Providers:  IncredibleEmployment.be  This test is no t yet approved or cleared by the [place] FDA and  has been authorized for detection and/or diagnosis of SARS-CoV-2 by FDA under an Emergency Use Authorization (EUA). This EUA will remain  in effect (meaning this test can be used) for the duration of the COVID-19 declaration under Section 564(b)(1) of the Act, 21 U.S.C.section 360bbb-3(b)(1), unless the authorization is terminated  or revoked sooner.       Influenza A by PCR NEGATIVE NEGATIVE Final   Influenza B by PCR NEGATIVE NEGATIVE Final    Comment: (NOTE) The Xpert Xpress SARS-CoV-2/FLU/RSV plus assay is intended as an aid in the diagnosis of influenza from Nasopharyngeal swab  specimens and should not be used as a sole basis for treatment. Nasal washings and aspirates are unacceptable for Xpert Xpress SARS-CoV-2/FLU/RSV testing.  Fact Sheet for Patients: BloggerCourse.comhttps://www.fda.gov/media/152166/download  Fact Sheet for Healthcare Providers: SeriousBroker.ithttps://www.fda.gov/media/152162/download  This test is not yet approved or cleared by the [place] FDA and has been authorized for detection and/or diagnosis of SARS-CoV-2 by FDA under an Emergency Use Authorization (EUA). This EUA will remain in effect (meaning this test can be used) for the duration of the COVID-19 declaration under Section 564(b)(1) of the Act, 21 U.S.C. section 360bbb-3(b)(1),  unless the authorization is terminated or revoked.  Performed at [facility] [place], [address] [place], [place] [address]   Blood Culture (routine x 2)     Status: None (Preliminary result)   Collection Time: 07/09/22  2:00 AM   Specimen: BLOOD LEFT ARM  Result Value Ref Range Status   Specimen Description   Final    BLOOD LEFT ARM Performed at [facility] [place], [address] [place], [place] [address]    Special Requests   Final    BOTTLES DRAWN AEROBIC AND ANAEROBIC Blood Culture adequate volume Performed at [facility] [place], [address] [place], [place] [address]    Culture   Final    NO GROWTH < 24 HOURS Performed at [facility], [address] [place], [place] [address]    Report Status PENDING  Incomplete  Blood Culture (routine x 2)     Status: None (Preliminary result)   Collection Time: 07/09/22  2:20 AM   Specimen: BLOOD LEFT HAND  Result Value Ref Range Status   Specimen Description   Final    BLOOD LEFT HAND Performed at [facility] [place], [address] [place], [place] [address]    Special Requests   Final    BOTTLES DRAWN AEROBIC AND ANAEROBIC Blood Culture results may not be optimal due to an inadequate volume of blood received in culture bottles Performed at [facility] [place], [address] [place], [place] [address]    Culture   Final    NO GROWTH < 24 HOURS Performed at [facility], [address] [place], [place] [address]    Report Status PENDING  Incomplete  Urine Culture     Status: None   Collection Time: 07/09/22  3:43 AM   Specimen: In/Out Cath Urine  Result Value Ref Range Status   Specimen Description   Final    IN/OUT CATH URINE Performed at [facility] [place], [address] [place], [place] [address]    Special Requests   Final    Normal Performed at [facility] [place], [address] [place], [place] [address]    Culture   Final    NO GROWTH Performed at  [facility], [address] [place], [place] [address]    Report Status 07/10/2022 FINAL  Final    Labs: CBC: Recent Labs  Lab 07/09/22 0158 07/09/22 1059 07/10/22 0430  WBC 12.9* 8.8 8.0  NEUTROABS 9.3* 6.5  --   HGB 13.5 12.6 12.1  HCT 39.8 37.0 36.5  MCV 85.4 85.8 87.5  PLT 350 302 302   Basic Metabolic Panel: Recent Labs  Lab 07/09/22 0158 07/09/22 1059 07/10/22 0430  NA 136 139 139  K 3.6 3.7 3.3*  CL 102 107 109  CO2 25 25 27   GLUCOSE 109* 107*  101*  BUN 14 7 9   CREATININE 0.66 0.69 0.68  CALCIUM 8.7* 8.8* 8.5*   Liver Function Tests: Recent Labs  Lab 07/09/22 0158 07/09/22 1059 07/10/22 0430  AST 31 19 18   ALT 36 29 25  ALKPHOS 85 63 67  BILITOT 1.3* 1.4* 0.9  PROT 8.1 6.7 6.5  ALBUMIN 4.4 4.0 3.5   CBG: No results for input(s): "GLUCAP" in the last 168 hours.  Discharge time spent: greater than 30 minutes.  Signed: 13/07/23, MD [facility] 07/10/2022

## 2022-07-14 LAB — CULTURE, BLOOD (ROUTINE X 2)
Culture: NO GROWTH
Culture: NO GROWTH
Special Requests: ADEQUATE

## 2022-07-16 ENCOUNTER — Ambulatory Visit (INDEPENDENT_AMBULATORY_CARE_PROVIDER_SITE_OTHER): Payer: Medicaid Other

## 2022-07-16 ENCOUNTER — Other Ambulatory Visit (HOSPITAL_COMMUNITY)
Admission: RE | Admit: 2022-07-16 | Discharge: 2022-07-16 | Disposition: A | Discharge status: Home / Self Care | Payer: Medicaid Other | Source: Ambulatory Visit | Attending: Family Medicine | Admitting: Family Medicine

## 2022-07-16 VITALS — BP 136/88 | HR 116

## 2022-07-16 DIAGNOSIS — R7309 Other abnormal glucose: Secondary | ICD-10-CM | POA: Diagnosis not present

## 2022-07-16 DIAGNOSIS — N898 Other specified noninflammatory disorders of vagina: Secondary | ICD-10-CM

## 2022-07-16 DIAGNOSIS — R5383 Other fatigue: Secondary | ICD-10-CM | POA: Diagnosis not present

## 2022-07-16 DIAGNOSIS — Z6829 Body mass index (BMI) 29.0-29.9, adult: Secondary | ICD-10-CM | POA: Diagnosis not present

## 2022-07-16 DIAGNOSIS — Z113 Encounter for screening for infections with a predominantly sexual mode of transmission: Secondary | ICD-10-CM | POA: Diagnosis not present

## 2022-07-16 DIAGNOSIS — R300 Dysuria: Secondary | ICD-10-CM | POA: Diagnosis not present

## 2022-07-16 DIAGNOSIS — Z79899 Other long term (current) drug therapy: Secondary | ICD-10-CM | POA: Diagnosis not present

## 2022-07-16 NOTE — Progress Notes (Signed)
Patient states that she was just treated for UTI in hospital admission. Patient is now having yeast infection symptoms- vaginal discharge and itching. Patient perform self swab today. Patient requests that we run STD testing on the self swab.  Patient also would like urine culture done as well to see if she has cleared up from her most recent hospital stay. She states she is having some urinary burning with using the restroom.  Patient requests follow up on liver test- I encourage her to do this with her PCP as we are a specialty and they should follow up for this. Armandina Stammer RN

## 2022-07-17 LAB — CERVICOVAGINAL ANCILLARY ONLY
Bacterial Vaginitis (gardnerella): POSITIVE — AB
Candida Glabrata: NEGATIVE
Candida Vaginitis: NEGATIVE
Chlamydia: NEGATIVE
Comment: NEGATIVE
Comment: NEGATIVE
Comment: NEGATIVE
Comment: NEGATIVE
Comment: NEGATIVE
Comment: NORMAL
Neisseria Gonorrhea: NEGATIVE
Trichomonas: NEGATIVE

## 2022-07-17 MED ORDER — METRONIDAZOLE 500 MG PO TABS: 500.0000 mg | ORAL_TABLET | Freq: Two times a day (BID) | ORAL | 0 refills | Status: AC

## 2022-07-17 NOTE — Addendum Note (Signed)
Addended by: Reva Bores on: 07/17/2022 03:36 PM   Modules accepted: Orders

## 2022-07-18 ENCOUNTER — Telehealth: Payer: Self-pay

## 2022-07-18 DIAGNOSIS — B379 Candidiasis, unspecified: Secondary | ICD-10-CM

## 2022-07-18 LAB — URINE CULTURE

## 2022-07-18 MED ORDER — FLUCONAZOLE 150 MG PO TABS: ORAL_TABLET | ORAL | 1 refills | Status: DC

## 2022-07-18 NOTE — Telephone Encounter (Signed)
-----   Message from Marti Sleigh, [place] sent at 07/18/2022  2:04 PM EST ----- Regarding: Request Call Back Has questions about her lab results

## 2022-07-18 NOTE — Telephone Encounter (Signed)
Patient called requesting urine culture results. Patient made aware that her urine culture was negative for a UTI. Patient states she develops a yeast infection after taking antibiotics. Diflucan 150 mg PO once x 1 refill was sent to her pharmacy. Understanding was voiced. Marlys Stegmaier l Luz Mares, CMA

## 2022-07-30 ENCOUNTER — Other Ambulatory Visit (HOSPITAL_BASED_OUTPATIENT_CLINIC_OR_DEPARTMENT_OTHER): Payer: Self-pay | Admitting: Internal Medicine

## 2022-07-30 ENCOUNTER — Ambulatory Visit (HOSPITAL_BASED_OUTPATIENT_CLINIC_OR_DEPARTMENT_OTHER)
Admission: RE | Admit: 2022-07-30 | Discharge: 2022-07-30 | Disposition: A | Discharge status: Home / Self Care | Payer: Medicaid Other | Source: Ambulatory Visit | Attending: Internal Medicine | Admitting: Internal Medicine

## 2022-07-30 DIAGNOSIS — F419 Anxiety disorder, unspecified: Secondary | ICD-10-CM | POA: Diagnosis not present

## 2022-07-30 DIAGNOSIS — R300 Dysuria: Secondary | ICD-10-CM | POA: Diagnosis not present

## 2022-07-30 DIAGNOSIS — N83209 Unspecified ovarian cyst, unspecified side: Secondary | ICD-10-CM | POA: Diagnosis not present

## 2022-07-30 DIAGNOSIS — F32A Depression, unspecified: Secondary | ICD-10-CM | POA: Diagnosis not present

## 2022-07-30 DIAGNOSIS — D75839 Thrombocytosis, unspecified: Secondary | ICD-10-CM | POA: Diagnosis not present

## 2022-07-30 DIAGNOSIS — K760 Fatty (change of) liver, not elsewhere classified: Secondary | ICD-10-CM | POA: Diagnosis not present

## 2022-07-30 DIAGNOSIS — R102 Pelvic and perineal pain: Secondary | ICD-10-CM | POA: Diagnosis not present

## 2022-08-03 DIAGNOSIS — Z419 Encounter for procedure for purposes other than remedying health state, unspecified: Secondary | ICD-10-CM | POA: Diagnosis not present

## 2022-08-09 DIAGNOSIS — F418 Other specified anxiety disorders: Secondary | ICD-10-CM | POA: Diagnosis not present

## 2022-08-09 DIAGNOSIS — F450 Somatization disorder: Secondary | ICD-10-CM | POA: Diagnosis not present

## 2022-08-09 DIAGNOSIS — R519 Headache, unspecified: Secondary | ICD-10-CM | POA: Diagnosis not present

## 2022-08-09 DIAGNOSIS — G894 Chronic pain syndrome: Secondary | ICD-10-CM | POA: Diagnosis not present

## 2022-08-20 ENCOUNTER — Encounter: Payer: Self-pay | Admitting: Obstetrics and Gynecology

## 2022-08-20 ENCOUNTER — Other Ambulatory Visit (HOSPITAL_COMMUNITY)
Admission: RE | Admit: 2022-08-20 | Discharge: 2022-08-20 | Disposition: A | Discharge status: Home / Self Care | Payer: Medicaid Other | Source: Ambulatory Visit | Attending: Obstetrics and Gynecology | Admitting: Obstetrics and Gynecology

## 2022-08-20 ENCOUNTER — Ambulatory Visit (INDEPENDENT_AMBULATORY_CARE_PROVIDER_SITE_OTHER): Payer: Medicaid Other | Admitting: Obstetrics and Gynecology

## 2022-08-20 VITALS — BP 120/81 | HR 97 | Ht 63.0 in | Wt 169.0 lb

## 2022-08-20 DIAGNOSIS — Z01419 Encounter for gynecological examination (general) (routine) without abnormal findings: Secondary | ICD-10-CM | POA: Diagnosis not present

## 2022-08-20 DIAGNOSIS — N946 Dysmenorrhea, unspecified: Secondary | ICD-10-CM | POA: Diagnosis not present

## 2022-08-20 NOTE — Progress Notes (Signed)
ANNUAL EXAM Patient name: Cindy Martinez MRN [id number]  Date of birth: 1995/10/09 Chief Complaint:   Gynecologic Exam  History of Present Illness:   Cindy Martinez is a 26 y.o. G2P2001 being seen today for a routine annual exam.  Current complaints: back pain and dysmenorrhea   Notes having vaginal discharge without itching. Recent ED visit- told had BV and took meds then diflucan for uspected yeast afterwards. Intermittent sexual activity - partner often gone for several weeks at a time but when present, no pain with intercourse.   Current episode of back pain and concern for kidney stones or pyelo both of which she has had in the past. Urine studies from ED visit were negative for infection. Denies fever, chills, nausea and vomiting. Pain in right flank. Also has baseline lower back pain but this pain is different from that.   Also mentioned interest in hysterectomy due to hx of dysmenorrhea and ovarian cysts. Previously tried medication for dysmenorrhea didn't help at all. Has had tubal ligation and therefore doesn't want to be on birth control. . 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  Patient's last menstrual period was 07/22/2022 (approximate).   The pregnancy intention screening data noted above was reviewed. Potential methods of contraception were discussed. The patient elected to proceed with No data recorded.   Last pap 122/2022. Results were: ASCUS w/ HRHPV negative. H/O abnormal pap: yes Last mammogram: n/a Last colonoscopy: n/a      No data to display               No data to display           Review of Systems:   Pertinent items are noted in HPI Denies any headaches, blurred vision, fatigue, shortness of breath, chest pain, abdominal pain, abnormal vaginal discharge/itching/odor/irritation, problems with periods, bowel movements, urination, or  intercourse unless otherwise stated above. Pertinent History Reviewed:  Reviewed past medical,surgical, social and family history.  Reviewed problem list, medications and allergies. Physical Assessment:   Vitals:   08/20/22 1042  BP: 120/81  Pulse: 97  Weight: 169 lb (76.7 kg)  Height: 5\' 3"  (1.6 m)  Body mass index is 29.94 kg/m.        Physical Examination:   General appearance - well appearing, and in no distress  Mental status - alert, oriented to person, place, and time  Psych:  She has a normal mood and affect  Skin - warm and dry, normal color, no suspicious lesions noted  Chest - effort normal, all lung fields clear to auscultation bilaterally  Heart - normal rate and regular rhythm  Breasts - breasts appear normal, no suspicious masses, no skin or nipple changes or  axillary nodes  Abdomen - soft, nontender, nondistended, no masses or organomegaly, mild suprapubic tenderness  Pelvic -  VULVA: normal appearing vulva with no masses, tenderness or lesions   VAGINA: normal appearing vagina with normal color and discharge, no lesions   CERVIX: normal appearing cervix without discharge or lesions, no CMT  Thin prep pap is done with HR HPV cotesting  UTERUS: uterus is felt to be normal size, shape, consistency and nontender, nontender levator ani bilaterally   ADNEXA: No adnexal masses or tenderness noted.  Extremities:  No swelling or varicosities noted  MUSC: mild left CVAT, no right CVAT, mild lower paraspinal tenderness  Chaperone present for exam  No results found for this or any previous visit (from the past 24 hour(s)).    Assessment & Plan:  1. Well female exam with routine gynecological exam Routine screening - Cytology - PAP( [place]) - Cervicovaginal ancillary only( [place])  2. Dysmenorrhea Follow up to discuss dysmenorrhea and management further. Does not seem interested in hormonal intervention due to having had tubal and has concerns regarding hx  of abnormal pap smears.    Labs/procedures today: pap, urine  Mammogram: @ 26yo, or sooner if problems Colonoscopy: @ 26yo, or sooner if problems  No orders of the defined types were placed in this encounter.   Meds: No orders of the defined types were placed in this encounter.   Follow-up: Return for GYN Follow Up.  Lorriane Shire, MD 08/20/2022 1:16 PM

## 2022-08-21 ENCOUNTER — Telehealth: Payer: Self-pay

## 2022-08-21 LAB — CERVICOVAGINAL ANCILLARY ONLY
Bacterial Vaginitis (gardnerella): POSITIVE — AB
Candida Glabrata: NEGATIVE
Candida Vaginitis: NEGATIVE
Comment: NEGATIVE
Comment: NEGATIVE
Comment: NEGATIVE

## 2022-08-21 NOTE — Telephone Encounter (Signed)
-----   Message from Marti Sleigh, [place] sent at 08/21/2022  3:29 PM EST ----- Regarding: Results Results are back and she has BV.  She just finished meds for that and has it again.  Wants to know other medication options since the one that she always take doesn't seem to be working.

## 2022-08-22 ENCOUNTER — Other Ambulatory Visit: Payer: Self-pay | Admitting: Obstetrics and Gynecology

## 2022-08-22 ENCOUNTER — Encounter: Payer: Self-pay | Admitting: Obstetrics and Gynecology

## 2022-08-22 DIAGNOSIS — B9689 Other specified bacterial agents as the cause of diseases classified elsewhere: Secondary | ICD-10-CM

## 2022-08-22 MED ORDER — CLINDAMYCIN PHOSPHATE 2 % VA CREA: 1.0000 | TOPICAL_CREAM | Freq: Every day | VAGINAL | 0 refills | Status: DC

## 2022-08-23 LAB — CYTOLOGY - PAP
Chlamydia: NEGATIVE
Comment: NEGATIVE
Comment: NEGATIVE
Comment: NORMAL
Diagnosis: NEGATIVE
High risk HPV: NEGATIVE
Neisseria Gonorrhea: NEGATIVE

## 2022-09-03 DIAGNOSIS — Z419 Encounter for procedure for purposes other than remedying health state, unspecified: Secondary | ICD-10-CM | POA: Diagnosis not present

## 2022-09-18 ENCOUNTER — Other Ambulatory Visit: Payer: Self-pay | Admitting: Obstetrics and Gynecology

## 2022-09-18 DIAGNOSIS — N760 Acute vaginitis: Secondary | ICD-10-CM

## 2022-09-18 MED ORDER — CLEOCIN 100 MG VA SUPP: 100.0000 mg | Freq: Every day | VAGINAL | 0 refills | Status: DC

## 2022-09-28 ENCOUNTER — Emergency Department (HOSPITAL_BASED_OUTPATIENT_CLINIC_OR_DEPARTMENT_OTHER)
Admission: EM | Admit: 2022-09-28 | Discharge: 2022-09-28 | Disposition: A | Discharge status: Home / Self Care | Payer: Medicaid Other | Attending: Emergency Medicine | Admitting: Emergency Medicine

## 2022-09-28 ENCOUNTER — Other Ambulatory Visit: Payer: Self-pay

## 2022-09-28 DIAGNOSIS — R109 Unspecified abdominal pain: Secondary | ICD-10-CM | POA: Diagnosis not present

## 2022-09-28 DIAGNOSIS — Z5321 Procedure and treatment not carried out due to patient leaving prior to being seen by health care provider: Secondary | ICD-10-CM | POA: Diagnosis not present

## 2022-09-28 LAB — CBC WITH DIFFERENTIAL/PLATELET
Abs Immature Granulocytes: 0.02 10*3/uL (ref 0.00–0.07)
Basophils Absolute: 0 10*3/uL (ref 0.0–0.1)
Basophils Relative: 1 %
Eosinophils Absolute: 0.1 10*3/uL (ref 0.0–0.5)
Eosinophils Relative: 1 %
HCT: 41.9 % (ref 36.0–46.0)
Hemoglobin: 14.2 g/dL (ref 12.0–15.0)
Immature Granulocytes: 0 %
Lymphocytes Relative: 24 %
Lymphs Abs: 1.7 10*3/uL (ref 0.7–4.0)
MCH: 28.6 pg (ref 26.0–34.0)
MCHC: 33.9 g/dL (ref 30.0–36.0)
MCV: 84.3 fL (ref 80.0–100.0)
Monocytes Absolute: 0.6 10*3/uL (ref 0.1–1.0)
Monocytes Relative: 8 %
Neutro Abs: 4.6 10*3/uL (ref 1.7–7.7)
Neutrophils Relative %: 66 %
Platelets: 368 10*3/uL (ref 150–400)
RBC: 4.97 MIL/uL (ref 3.87–5.11)
RDW: 12.3 % (ref 11.5–15.5)
WBC: 7 10*3/uL (ref 4.0–10.5)
nRBC: 0 % (ref 0.0–0.2)

## 2022-09-28 LAB — BASIC METABOLIC PANEL
Anion gap: 7 (ref 5–15)
BUN: 12 mg/dL (ref 6–20)
CO2: 26 mmol/L (ref 22–32)
Calcium: 8.4 mg/dL — ABNORMAL LOW (ref 8.9–10.3)
Chloride: 101 mmol/L (ref 98–111)
Creatinine, Ser: 0.64 mg/dL (ref 0.44–1.00)
GFR, Estimated: >60 mL/min (ref >60)
Glucose, Bld: 98 mg/dL (ref 70–99)
Potassium: 3.6 mmol/L (ref 3.5–5.1)
Sodium: 134 mmol/L — ABNORMAL LOW (ref 135–145)

## 2022-09-28 NOTE — ED Triage Notes (Signed)
Patient presents to ED via POV from home. Here with abdominal pain since last Friday. History of ovarian cyst.

## 2022-09-30 DIAGNOSIS — Z87442 Personal history of urinary calculi: Secondary | ICD-10-CM | POA: Diagnosis not present

## 2022-09-30 DIAGNOSIS — R1031 Right lower quadrant pain: Secondary | ICD-10-CM | POA: Diagnosis not present

## 2022-09-30 DIAGNOSIS — N200 Calculus of kidney: Secondary | ICD-10-CM | POA: Diagnosis not present

## 2022-10-04 ENCOUNTER — Ambulatory Visit: Payer: Medicaid Other

## 2022-10-04 VITALS — BP 136/80 | HR 89 | Ht 63.0 in

## 2022-10-04 DIAGNOSIS — Z419 Encounter for procedure for purposes other than remedying health state, unspecified: Secondary | ICD-10-CM | POA: Diagnosis not present

## 2022-10-04 DIAGNOSIS — R300 Dysuria: Secondary | ICD-10-CM

## 2022-10-04 NOTE — Progress Notes (Signed)
SUBJECTIVE: Cindy Martinez is a 27 y.o. female who complains of urinary frequency, urgency and dysuria x 3 days, without flank pain, fever, chills, or abnormal vaginal discharge or bleeding.   OBJECTIVE: Appears well, in no apparent distress.  Vital signs are normal.  ASSESSMENT: Dysuria  PLAN: Treatment per orders.  Call or return to clinic prn if these symptoms worsen or fail to improve as anticipated. Patient offered testing for GC/CHl and does not want that performed. Kathrene Alu RN

## 2022-10-06 LAB — URINE CULTURE: Organism ID, Bacteria: NO GROWTH

## 2022-10-18 DIAGNOSIS — E559 Vitamin D deficiency, unspecified: Secondary | ICD-10-CM | POA: Diagnosis not present

## 2022-10-18 DIAGNOSIS — F32A Depression, unspecified: Secondary | ICD-10-CM | POA: Diagnosis not present

## 2022-10-18 DIAGNOSIS — K760 Fatty (change of) liver, not elsewhere classified: Secondary | ICD-10-CM | POA: Diagnosis not present

## 2022-10-18 DIAGNOSIS — E7800 Pure hypercholesterolemia, unspecified: Secondary | ICD-10-CM | POA: Diagnosis not present

## 2022-10-18 DIAGNOSIS — D539 Nutritional anemia, unspecified: Secondary | ICD-10-CM | POA: Diagnosis not present

## 2022-10-18 DIAGNOSIS — R5383 Other fatigue: Secondary | ICD-10-CM | POA: Diagnosis not present

## 2022-10-18 DIAGNOSIS — F419 Anxiety disorder, unspecified: Secondary | ICD-10-CM | POA: Diagnosis not present

## 2022-10-29 DIAGNOSIS — Z6830 Body mass index (BMI) 30.0-30.9, adult: Secondary | ICD-10-CM | POA: Diagnosis not present

## 2022-10-29 DIAGNOSIS — F988 Other specified behavioral and emotional disorders with onset usually occurring in childhood and adolescence: Secondary | ICD-10-CM | POA: Diagnosis not present

## 2022-10-29 DIAGNOSIS — Z79899 Other long term (current) drug therapy: Secondary | ICD-10-CM | POA: Diagnosis not present

## 2022-10-29 DIAGNOSIS — F411 Generalized anxiety disorder: Secondary | ICD-10-CM | POA: Diagnosis not present

## 2022-10-29 DIAGNOSIS — R5383 Other fatigue: Secondary | ICD-10-CM | POA: Diagnosis not present

## 2022-10-31 DIAGNOSIS — Z79899 Other long term (current) drug therapy: Secondary | ICD-10-CM | POA: Diagnosis not present

## 2022-11-02 DIAGNOSIS — Z419 Encounter for procedure for purposes other than remedying health state, unspecified: Secondary | ICD-10-CM | POA: Diagnosis not present

## 2022-11-12 DIAGNOSIS — F988 Other specified behavioral and emotional disorders with onset usually occurring in childhood and adolescence: Secondary | ICD-10-CM | POA: Diagnosis not present

## 2022-11-12 DIAGNOSIS — Z6830 Body mass index (BMI) 30.0-30.9, adult: Secondary | ICD-10-CM | POA: Diagnosis not present

## 2022-12-03 DIAGNOSIS — Z419 Encounter for procedure for purposes other than remedying health state, unspecified: Secondary | ICD-10-CM | POA: Diagnosis not present

## 2022-12-12 DIAGNOSIS — F909 Attention-deficit hyperactivity disorder, unspecified type: Secondary | ICD-10-CM | POA: Diagnosis not present

## 2022-12-12 DIAGNOSIS — Z6830 Body mass index (BMI) 30.0-30.9, adult: Secondary | ICD-10-CM | POA: Diagnosis not present

## 2022-12-12 DIAGNOSIS — Z79899 Other long term (current) drug therapy: Secondary | ICD-10-CM | POA: Diagnosis not present

## 2022-12-12 DIAGNOSIS — N3001 Acute cystitis with hematuria: Secondary | ICD-10-CM | POA: Diagnosis not present

## 2022-12-13 DIAGNOSIS — D509 Iron deficiency anemia, unspecified: Secondary | ICD-10-CM | POA: Diagnosis not present

## 2022-12-13 DIAGNOSIS — E049 Nontoxic goiter, unspecified: Secondary | ICD-10-CM | POA: Diagnosis not present

## 2022-12-13 DIAGNOSIS — D75839 Thrombocytosis, unspecified: Secondary | ICD-10-CM | POA: Diagnosis not present

## 2022-12-17 ENCOUNTER — Other Ambulatory Visit: Payer: Self-pay

## 2022-12-17 ENCOUNTER — Emergency Department (HOSPITAL_BASED_OUTPATIENT_CLINIC_OR_DEPARTMENT_OTHER): Payer: Medicaid Other

## 2022-12-17 DIAGNOSIS — R300 Dysuria: Secondary | ICD-10-CM | POA: Diagnosis not present

## 2022-12-17 DIAGNOSIS — R0789 Other chest pain: Secondary | ICD-10-CM | POA: Diagnosis not present

## 2022-12-17 DIAGNOSIS — N200 Calculus of kidney: Secondary | ICD-10-CM | POA: Diagnosis not present

## 2022-12-17 DIAGNOSIS — N281 Cyst of kidney, acquired: Secondary | ICD-10-CM | POA: Diagnosis not present

## 2022-12-17 DIAGNOSIS — R109 Unspecified abdominal pain: Secondary | ICD-10-CM | POA: Insufficient documentation

## 2022-12-17 DIAGNOSIS — R0602 Shortness of breath: Secondary | ICD-10-CM | POA: Diagnosis not present

## 2022-12-17 LAB — CBC
HCT: 41.3 % (ref 36.0–46.0)
Hemoglobin: 14 g/dL (ref 12.0–15.0)
MCH: 28.5 pg (ref 26.0–34.0)
MCHC: 33.9 g/dL (ref 30.0–36.0)
MCV: 84.1 fL (ref 80.0–100.0)
Platelets: 390 10*3/uL (ref 150–400)
RBC: 4.91 MIL/uL (ref 3.87–5.11)
RDW: 12.4 % (ref 11.5–15.5)
WBC: 11.6 10*3/uL — ABNORMAL HIGH (ref 4.0–10.5)
nRBC: 0 % (ref 0.0–0.2)

## 2022-12-17 LAB — BASIC METABOLIC PANEL
Anion gap: 10 (ref 5–15)
BUN: 13 mg/dL (ref 6–20)
CO2: 25 mmol/L (ref 22–32)
Calcium: 8.7 mg/dL — ABNORMAL LOW (ref 8.9–10.3)
Chloride: 101 mmol/L (ref 98–111)
Creatinine, Ser: 0.63 mg/dL (ref 0.44–1.00)
GFR, Estimated: >60 mL/min (ref >60)
Glucose, Bld: 95 mg/dL (ref 70–99)
Potassium: 3.7 mmol/L (ref 3.5–5.1)
Sodium: 136 mmol/L (ref 135–145)

## 2022-12-17 LAB — D-DIMER, QUANTITATIVE: D-Dimer, Quant: <0.27 ug/mL-FEU (ref 0.00–0.50)

## 2022-12-17 LAB — TROPONIN I (HIGH SENSITIVITY): Troponin I (High Sensitivity): <2 ng/L (ref <18)

## 2022-12-17 NOTE — ED Triage Notes (Signed)
Pt was on Macrobid for for UTI since Wednesday - they did a culture and then was prescribed Cipro today d/t results.  At noon when she took the Cipro for the first time she began vomiting had CP and SOB.  Pt denies SOB or difficulty breathing but still having CP.

## 2022-12-18 ENCOUNTER — Emergency Department (HOSPITAL_BASED_OUTPATIENT_CLINIC_OR_DEPARTMENT_OTHER): Payer: Medicaid Other

## 2022-12-18 ENCOUNTER — Emergency Department (HOSPITAL_BASED_OUTPATIENT_CLINIC_OR_DEPARTMENT_OTHER)
Admission: EM | Admit: 2022-12-18 | Discharge: 2022-12-18 | Disposition: A | Discharge status: Home / Self Care | Payer: Medicaid Other | Attending: Emergency Medicine | Admitting: Emergency Medicine

## 2022-12-18 ENCOUNTER — Encounter (HOSPITAL_BASED_OUTPATIENT_CLINIC_OR_DEPARTMENT_OTHER): Payer: Self-pay

## 2022-12-18 DIAGNOSIS — R300 Dysuria: Secondary | ICD-10-CM

## 2022-12-18 DIAGNOSIS — N200 Calculus of kidney: Secondary | ICD-10-CM | POA: Diagnosis not present

## 2022-12-18 DIAGNOSIS — R109 Unspecified abdominal pain: Secondary | ICD-10-CM

## 2022-12-18 DIAGNOSIS — N281 Cyst of kidney, acquired: Secondary | ICD-10-CM | POA: Diagnosis not present

## 2022-12-18 LAB — URINALYSIS, ROUTINE W REFLEX MICROSCOPIC
Bilirubin Urine: NEGATIVE
Glucose, UA: NEGATIVE mg/dL
Hgb urine dipstick: NEGATIVE
Ketones, ur: NEGATIVE mg/dL
Leukocytes,Ua: NEGATIVE
Nitrite: NEGATIVE
Protein, ur: NEGATIVE mg/dL
Specific Gravity, Urine: >=1.03 (ref 1.005–1.030)
pH: 5.5 (ref 5.0–8.0)

## 2022-12-18 LAB — PREGNANCY, URINE: Preg Test, Ur: NEGATIVE

## 2022-12-18 MED ORDER — ONDANSETRON 4 MG PO TBDP
4.0000 mg | ORAL_TABLET | Freq: Once | ORAL | Status: AC
  Administered 2022-12-18: 4 mg via ORAL
  Filled 2022-12-18: qty 1

## 2022-12-18 MED ORDER — CEFDINIR 300 MG PO CAPS: 300.0000 mg | ORAL_CAPSULE | Freq: Two times a day (BID) | ORAL | 0 refills | Status: DC

## 2022-12-18 MED ORDER — ONDANSETRON 4 MG PO TBDP: 4.0000 mg | ORAL_TABLET | Freq: Three times a day (TID) | ORAL | 0 refills | Status: DC | PRN

## 2022-12-18 NOTE — ED Provider Notes (Signed)
Cindy Martinez   CSN: [id number] Arrival date & time: 12/17/22  2025     History  Chief Complaint  Patient presents with   Medication Reaction    Cindy Martinez is a 27 y.o. female.  The history is provided by the patient and medical records.   Cindy Martinez is a 27 y.o. female who presents to the Emergency Department complaining of medication reaction.  She presents to the ED for evaluation of possible allergic reaction to an antibiotic.  She has been having urinary tract infection symptoms for 2 weeks with dysuria as well as 1 week of left flank pain.  She saw her PCP at [facility] on Wednesday and was started on Macrobid.  She had a culture come back that was positive for Klebsiella that was both resistant to ampicillin and Macrobid.  She reached out to her PCP regarding this and they called in Cipro, which she started today.  About 1 hour after taking the Cipro she developed chest pain and vomiting.  She does have a history of kidney stones, no additional medical problems.     Home Medications Prior to Admission medications   Medication Sig Start Date End Date Taking? Authorizing Provider  acetaminophen (TYLENOL) 500 MG tablet Take 1,000 mg by mouth every 6 (six) hours as needed for mild pain. Patient not taking: Reported on 07/16/2022    [provider]  busPIRone (BUSPAR) 15 MG tablet Take 15 mg by mouth 3 (three) times daily as needed (anxiety). Patient not taking: Reported on 07/16/2022    [provider]  cefdinir (OMNICEF) 300 MG capsule Take 1 capsule (300 mg total) by mouth 2 (two) times daily. 12/18/22   Tilden Fossa, MD  celecoxib (CELEBREX) 200 MG capsule Take 200 mg by mouth daily. Patient not taking: Reported on 07/16/2022    [provider]  clindamycin (CLEOCIN) 100 MG vaginal suppository Place 1 suppository (100 mg total) vaginally at bedtime. Patient not taking:  Reported on 10/04/2022 09/18/22   Lorriane Shire, MD  escitalopram (LEXAPRO) 10 MG tablet Take 10 mg by mouth daily as needed (anxiety). Patient not taking: Reported on 07/16/2022    [provider]  fluconazole (DIFLUCAN) 150 MG tablet Take 1 tablet by mouth. Repeat in 3 days if symptoms persist. Patient not taking: Reported on 08/20/2022 07/18/22   Willodean Rosenthal, MD  ondansetron (ZOFRAN-ODT) 4 MG disintegrating tablet Take 1 tablet (4 mg total) by mouth every 8 (eight) hours as needed for nausea or vomiting. 12/18/22   Tilden Fossa, MD  traZODone (DESYREL) 100 MG tablet Take 100 mg by mouth at bedtime as needed for sleep. Patient not taking: Reported on 07/16/2022    [provider]  prochlorperazine (COMPAZINE) 10 MG tablet Take 1 tablet (10 mg total) by mouth every 6 (six) hours as needed for nausea or vomiting. Patient not taking: Reported on 10/23/2019 10/16/19 03/19/20  Dione Booze, MD      Allergies    Ciprofloxacin, Meloxicam, and Omnipaque [iohexol]    Review of Systems   Review of Systems  All other systems reviewed and are negative.   Physical Exam Updated Vital Signs BP 120/81   Pulse 84   Temp 97.7 F (36.5 C) (Oral)   Resp 18   Ht  (1.6 m)   Wt 74.8 kg   LMP 11/20/2022   SpO2 100%   BMI 29.23 kg/m  Physical Exam Vitals and nursing Martinez reviewed.  Constitutional:      Appearance: She is well-developed.  HENT:     Head: Normocephalic and atraumatic.  Cardiovascular:     Rate and Rhythm: Normal rate and regular rhythm.     Heart sounds: No murmur heard. Pulmonary:     Effort: Pulmonary effort is normal. No respiratory distress.     Breath sounds: Normal breath sounds.  Abdominal:     Palpations: Abdomen is soft.     Tenderness: There is no abdominal tenderness. There is left CVA tenderness. There is no right CVA tenderness, guarding or rebound.  Musculoskeletal:        General: No swelling or tenderness.  Skin:     General: Skin is warm and dry.  Neurological:     Mental Status: She is alert and oriented to person, place, and time.  Psychiatric:        Behavior: Behavior normal.     ED Results / Procedures / Treatments   Labs (all labs ordered are listed, but only abnormal results are displayed) Labs Reviewed  BASIC METABOLIC PANEL - Abnormal; Notable for the following components:      Result Value   Calcium 8.7 (*)    All other components within normal limits  CBC - Abnormal; Notable for the following components:   WBC 11.6 (*)    All other components within normal limits  D-DIMER, QUANTITATIVE  URINALYSIS, ROUTINE W REFLEX MICROSCOPIC  PREGNANCY, URINE  TROPONIN I (HIGH SENSITIVITY)    EKG EKG Interpretation  Date/Time:  Monday December 17 2022 20:49:48 EDT Ventricular Rate:  111 PR Interval:  125 QRS Duration: 85 QT Interval:  309 QTC Calculation: 420 R Axis:   90 Text Interpretation: Sinus tachycardia Right atrial enlargement Consider right ventricular hypertrophy Confirmed by Ernie Avena ([id number]) on 12/17/2022 9:14:01 PM  Radiology CT Renal Stone Study  Result Date: 12/18/2022 CLINICAL DATA:  Abdominal pain, vomiting, UTI EXAM: CT ABDOMEN AND PELVIS WITHOUT CONTRAST TECHNIQUE: Multidetector CT imaging of the abdomen and pelvis was performed following the standard protocol without IV contrast. RADIATION DOSE REDUCTION: This exam was performed according to the departmental dose-optimization program which includes automated exposure control, adjustment of the mA and/or kV according to patient size and/or use of iterative reconstruction technique. COMPARISON:  07/09/2022 FINDINGS: Lower chest: Lung bases are clear. Hepatobiliary: Unenhanced liver is unremarkable. Gallbladder is unremarkable. No intrahepatic or extrahepatic duct dilatation. Pancreas: Within normal limits. Spleen: Within normal limits. Adrenals/Urinary Tract: Adrenal glands are within normal limits. 4 mm nonobstructing left  lower pole renal calculus (series 2/image 33). Additional punctate interpolar left renal calculus (coronal image 32). 12 mm simple right lower pole renal cyst (series 2/image 37), benign (Bosniak I). No follow-up is recommended. No hydronephrosis. Bladder is within normal limits. Stomach/Bowel: Stomach is within normal limits. No evidence of bowel obstruction. Normal appendix (series 2/image 86). No colonic wall thickening or inflammatory changes. Vascular/Lymphatic: No evidence of abdominal aortic aneurysm. No suspicious abdominopelvic lymphadenopathy. Reproductive: Uterus is within normal limits. Bilateral ovaries are within normal limits. Other: No abdominopelvic ascites. Musculoskeletal: Visualized osseous structures are within normal limits. IMPRESSION: Two nonobstructing left renal calculi measuring up to 4 mm in the lower pole. No hydronephrosis. Otherwise negative CT abdomen/pelvis. Electronically Signed   By: Charline Bills M.D.   On: 12/18/2022 03:13   DG Chest 2 View  Result Date: 12/17/2022 CLINICAL DATA:  Shortness of breath EXAM: CHEST - 2 VIEW COMPARISON:  07/09/2022 FINDINGS: The heart size and mediastinal contours are within normal  limits. Both lungs are clear. The visualized skeletal structures are unremarkable. IMPRESSION: Normal chest radiographs. Electronically Signed   By: Duanne Guess D.O.   On: 12/17/2022 21:12    Procedures Procedures    Medications Ordered in ED Medications  ondansetron (ZOFRAN-ODT) disintegrating tablet 4 mg (4 mg Oral Given 12/18/22 0222)    ED Course/ Medical Decision Making/ A&P                             Medical Decision Making Amount and/or Complexity of Data Reviewed Labs: ordered. Radiology: ordered.  Risk Prescription drug management.   Patient with history of kidney stones here for evaluation of chest pain, vomiting in setting of starting Cipro for urinary tract infection that is resistant to penicillin and Macrobid.  She has  previously been on Macrobid for several days.  She also complains of left flank pain.  On examination she is nontoxic-appearing with no respiratory distress.  She does have left CVA tenderness.  UA is not consistent with UTI.  Patient brings her records that are available on her app regarding culture sensitivities and urinalysis results from her PCP.  Given her history of kidney stones a CT stone study was obtained, which is negative for obstructing stone but does demonstrate 2 intrarenal stones on the left.  No evidence of renal abscess.  Current clinical picture is not consistent with anaphylaxis, ACS, PE, pneumonia.  Discussed that this is likely a Cipro allergy and recommend that she discontinue this medication.  Although her urinalysis does not appear infected currently do suspect that she has ongoing degree of infection given her flank pain, ongoing dysuria and culture report.  Will change to cefdinir.  Will provide ondansetron if she has recurrent nausea or vomiting.  Discussed home care, outpatient follow-up and return precautions.        Final Clinical Impression(s) / ED Diagnoses Final diagnoses:  Dysuria  Flank pain    Rx / DC Orders ED Discharge Orders          Ordered    cefdinir (OMNICEF) 300 MG capsule  2 times daily,   Status:  Discontinued        12/18/22 0345    ondansetron (ZOFRAN-ODT) 4 MG disintegrating tablet  Every 8 hours PRN,   Status:  Discontinued        12/18/22 0345    ondansetron (ZOFRAN-ODT) 4 MG disintegrating tablet  Every 8 hours PRN        12/18/22 0346    cefdinir (OMNICEF) 300 MG capsule  2 times daily        12/18/22 0346              Tilden Fossa, MD 12/18/22 [phone]

## 2022-12-21 DIAGNOSIS — N3001 Acute cystitis with hematuria: Secondary | ICD-10-CM | POA: Diagnosis not present

## 2022-12-21 DIAGNOSIS — Z6830 Body mass index (BMI) 30.0-30.9, adult: Secondary | ICD-10-CM | POA: Diagnosis not present

## 2022-12-21 DIAGNOSIS — M545 Low back pain, unspecified: Secondary | ICD-10-CM | POA: Diagnosis not present

## 2022-12-21 DIAGNOSIS — N200 Calculus of kidney: Secondary | ICD-10-CM | POA: Diagnosis not present

## 2023-01-02 DIAGNOSIS — Z419 Encounter for procedure for purposes other than remedying health state, unspecified: Secondary | ICD-10-CM | POA: Diagnosis not present

## 2023-01-11 DIAGNOSIS — Z79899 Other long term (current) drug therapy: Secondary | ICD-10-CM | POA: Diagnosis not present

## 2023-01-11 DIAGNOSIS — Z6830 Body mass index (BMI) 30.0-30.9, adult: Secondary | ICD-10-CM | POA: Diagnosis not present

## 2023-01-11 DIAGNOSIS — F909 Attention-deficit hyperactivity disorder, unspecified type: Secondary | ICD-10-CM | POA: Diagnosis not present

## 2023-01-11 DIAGNOSIS — N3001 Acute cystitis with hematuria: Secondary | ICD-10-CM | POA: Diagnosis not present

## 2023-01-14 DIAGNOSIS — Z79899 Other long term (current) drug therapy: Secondary | ICD-10-CM | POA: Diagnosis not present

## 2023-01-16 ENCOUNTER — Other Ambulatory Visit: Payer: Self-pay | Admitting: Obstetrics and Gynecology

## 2023-01-16 DIAGNOSIS — B9689 Other specified bacterial agents as the cause of diseases classified elsewhere: Secondary | ICD-10-CM

## 2023-01-16 DIAGNOSIS — N760 Acute vaginitis: Secondary | ICD-10-CM

## 2023-01-17 ENCOUNTER — Other Ambulatory Visit: Payer: Self-pay | Admitting: Obstetrics and Gynecology

## 2023-01-17 DIAGNOSIS — N760 Acute vaginitis: Secondary | ICD-10-CM

## 2023-01-17 MED ORDER — CLEOCIN 100 MG VA SUPP
100.0000 mg | Freq: Every day | VAGINAL | 0 refills | Status: DC
Start: 2023-01-17 — End: 2024-01-05

## 2023-01-17 MED ORDER — FLUCONAZOLE 150 MG PO TABS: 150.0000 mg | ORAL_TABLET | Freq: Once | ORAL | 0 refills | Status: AC

## 2023-01-23 DIAGNOSIS — K760 Fatty (change of) liver, not elsewhere classified: Secondary | ICD-10-CM | POA: Diagnosis not present

## 2023-01-23 DIAGNOSIS — R1011 Right upper quadrant pain: Secondary | ICD-10-CM | POA: Diagnosis not present

## 2023-01-31 DIAGNOSIS — R1011 Right upper quadrant pain: Secondary | ICD-10-CM | POA: Diagnosis not present

## 2023-02-02 DIAGNOSIS — Z419 Encounter for procedure for purposes other than remedying health state, unspecified: Secondary | ICD-10-CM | POA: Diagnosis not present

## 2023-02-25 DIAGNOSIS — R300 Dysuria: Secondary | ICD-10-CM | POA: Diagnosis not present

## 2023-02-25 DIAGNOSIS — Z3202 Encounter for pregnancy test, result negative: Secondary | ICD-10-CM | POA: Diagnosis not present

## 2023-03-04 DIAGNOSIS — Z419 Encounter for procedure for purposes other than remedying health state, unspecified: Secondary | ICD-10-CM | POA: Diagnosis not present

## 2023-04-04 DIAGNOSIS — Z419 Encounter for procedure for purposes other than remedying health state, unspecified: Secondary | ICD-10-CM | POA: Diagnosis not present

## 2023-04-10 DIAGNOSIS — Z79899 Other long term (current) drug therapy: Secondary | ICD-10-CM | POA: Diagnosis not present

## 2023-04-10 DIAGNOSIS — Z6828 Body mass index (BMI) 28.0-28.9, adult: Secondary | ICD-10-CM | POA: Diagnosis not present

## 2023-04-10 DIAGNOSIS — F909 Attention-deficit hyperactivity disorder, unspecified type: Secondary | ICD-10-CM | POA: Diagnosis not present

## 2023-04-10 DIAGNOSIS — Z3200 Encounter for pregnancy test, result unknown: Secondary | ICD-10-CM | POA: Diagnosis not present

## 2023-04-10 DIAGNOSIS — R399 Unspecified symptoms and signs involving the genitourinary system: Secondary | ICD-10-CM | POA: Diagnosis not present

## 2023-04-17 DIAGNOSIS — K648 Other hemorrhoids: Secondary | ICD-10-CM | POA: Diagnosis not present

## 2023-04-17 DIAGNOSIS — R1011 Right upper quadrant pain: Secondary | ICD-10-CM | POA: Diagnosis not present

## 2023-04-26 DIAGNOSIS — Z1211 Encounter for screening for malignant neoplasm of colon: Secondary | ICD-10-CM | POA: Diagnosis not present

## 2023-04-26 DIAGNOSIS — R1011 Right upper quadrant pain: Secondary | ICD-10-CM | POA: Diagnosis not present

## 2023-05-05 DIAGNOSIS — Z419 Encounter for procedure for purposes other than remedying health state, unspecified: Secondary | ICD-10-CM | POA: Diagnosis not present

## 2023-05-12 DIAGNOSIS — A048 Other specified bacterial intestinal infections: Secondary | ICD-10-CM | POA: Diagnosis not present

## 2023-05-29 DIAGNOSIS — R110 Nausea: Secondary | ICD-10-CM | POA: Diagnosis not present

## 2023-05-29 DIAGNOSIS — A048 Other specified bacterial intestinal infections: Secondary | ICD-10-CM | POA: Diagnosis not present

## 2023-06-03 DIAGNOSIS — H5213 Myopia, bilateral: Secondary | ICD-10-CM | POA: Diagnosis not present

## 2023-06-04 DIAGNOSIS — Z419 Encounter for procedure for purposes other than remedying health state, unspecified: Secondary | ICD-10-CM | POA: Diagnosis not present

## 2023-06-21 DIAGNOSIS — H9202 Otalgia, left ear: Secondary | ICD-10-CM | POA: Diagnosis not present

## 2023-06-21 DIAGNOSIS — J069 Acute upper respiratory infection, unspecified: Secondary | ICD-10-CM | POA: Diagnosis not present

## 2023-06-21 DIAGNOSIS — R000 Tachycardia, unspecified: Secondary | ICD-10-CM | POA: Diagnosis not present

## 2023-06-21 DIAGNOSIS — J029 Acute pharyngitis, unspecified: Secondary | ICD-10-CM | POA: Diagnosis not present

## 2023-07-03 ENCOUNTER — Ambulatory Visit: Payer: Medicaid Other

## 2023-07-03 DIAGNOSIS — R109 Unspecified abdominal pain: Secondary | ICD-10-CM | POA: Diagnosis not present

## 2023-07-05 DIAGNOSIS — Z419 Encounter for procedure for purposes other than remedying health state, unspecified: Secondary | ICD-10-CM | POA: Diagnosis not present

## 2023-07-06 DIAGNOSIS — Z79899 Other long term (current) drug therapy: Secondary | ICD-10-CM | POA: Diagnosis not present

## 2023-07-06 DIAGNOSIS — F909 Attention-deficit hyperactivity disorder, unspecified type: Secondary | ICD-10-CM | POA: Diagnosis not present

## 2023-07-06 DIAGNOSIS — Z6829 Body mass index (BMI) 29.0-29.9, adult: Secondary | ICD-10-CM | POA: Diagnosis not present

## 2023-07-06 DIAGNOSIS — Z3200 Encounter for pregnancy test, result unknown: Secondary | ICD-10-CM | POA: Diagnosis not present

## 2023-07-24 DIAGNOSIS — A048 Other specified bacterial intestinal infections: Secondary | ICD-10-CM | POA: Diagnosis not present

## 2023-08-04 DIAGNOSIS — Z419 Encounter for procedure for purposes other than remedying health state, unspecified: Secondary | ICD-10-CM | POA: Diagnosis not present

## 2023-08-05 DIAGNOSIS — Z6829 Body mass index (BMI) 29.0-29.9, adult: Secondary | ICD-10-CM | POA: Diagnosis not present

## 2023-08-05 DIAGNOSIS — Z3200 Encounter for pregnancy test, result unknown: Secondary | ICD-10-CM | POA: Diagnosis not present

## 2023-08-05 DIAGNOSIS — F909 Attention-deficit hyperactivity disorder, unspecified type: Secondary | ICD-10-CM | POA: Diagnosis not present

## 2023-08-05 DIAGNOSIS — Z79899 Other long term (current) drug therapy: Secondary | ICD-10-CM | POA: Diagnosis not present

## 2023-08-23 ENCOUNTER — Other Ambulatory Visit (HOSPITAL_COMMUNITY)
Admission: RE | Admit: 2023-08-23 | Discharge: 2023-08-23 | Disposition: A | Discharge status: Home / Self Care | Payer: Medicaid Other | Source: Ambulatory Visit | Attending: Obstetrics and Gynecology | Admitting: Obstetrics and Gynecology

## 2023-08-23 ENCOUNTER — Ambulatory Visit (INDEPENDENT_AMBULATORY_CARE_PROVIDER_SITE_OTHER): Payer: Medicaid Other | Admitting: Obstetrics and Gynecology

## 2023-08-23 VITALS — BP 139/74 | HR 95 | Ht 63.0 in | Wt 163.0 lb

## 2023-08-23 DIAGNOSIS — Z124 Encounter for screening for malignant neoplasm of cervix: Secondary | ICD-10-CM | POA: Insufficient documentation

## 2023-08-23 DIAGNOSIS — Z1151 Encounter for screening for human papillomavirus (HPV): Secondary | ICD-10-CM

## 2023-08-23 DIAGNOSIS — Z01419 Encounter for gynecological examination (general) (routine) without abnormal findings: Secondary | ICD-10-CM | POA: Insufficient documentation

## 2023-08-23 DIAGNOSIS — Z113 Encounter for screening for infections with a predominantly sexual mode of transmission: Secondary | ICD-10-CM | POA: Diagnosis not present

## 2023-08-23 DIAGNOSIS — L680 Hirsutism: Secondary | ICD-10-CM | POA: Diagnosis not present

## 2023-08-23 DIAGNOSIS — R1084 Generalized abdominal pain: Secondary | ICD-10-CM

## 2023-08-23 DIAGNOSIS — N946 Dysmenorrhea, unspecified: Secondary | ICD-10-CM | POA: Diagnosis not present

## 2023-08-23 DIAGNOSIS — N941 Unspecified dyspareunia: Secondary | ICD-10-CM | POA: Diagnosis not present

## 2023-08-23 DIAGNOSIS — Z1339 Encounter for screening examination for other mental health and behavioral disorders: Secondary | ICD-10-CM

## 2023-08-23 NOTE — Progress Notes (Addendum)
ANNUAL EXAM Patient name: Cindy Martinez MRN [id number]  Date of birth: 16-Jan-1996 Chief Complaint:   Gynecologic Exam  History of Present Illness:   Cindy Martinez is a 27 y.o. G2P2001 being seen today for a routine annual exam.  Current complaints: abdominal pain, dypsareunia  Menstrual concerns? Yes  may miss a few months at a time; will have some 'annoying' hair on face that she has to routinely remove -was prevoiusly given something to help with pain and stopped due to it causing drowsiness  -was told she may have endometriotis by ED provider.  -nexplanon bled for a long time  -had TL to avoid hormones -wanting to avoid hormones altogether in general  -not currently interested in surgery; not seen during prior surgery 8 years ago when having TL but notes not having complaints at that time -currently being evaluated by GI for further evaluation of abdominal pain  -notes having a cyst years ago  Breast or nipple changes? No  Contraception use? Yes s/p TL  Sexually active? Yes having pain with intercourse - feels like "its hitting her cervix" and htat it may  -same partner x3 yeasr  Has been off of buspar, lexapro, and tazadone for a few years  Vaginal dryness and feels she does not get sufficient moisture to engage in intercourse and previously told she should use lubricant Has "hot flashes" - will feel very hot or freezing throughout the day for several hours at a time  Patient's last menstrual period was 08/10/2023 (exact date).   The pregnancy intention screening data noted above was reviewed. Potential methods of contraception were discussed. The patient elected to proceed with No data recorded.   Last pap     Component Value Date/Time   DIAGPAP  08/20/2022 1126    - Negative for intraepithelial lesion or malignancy (NILM)   DIAGPAP (A) 08/14/2021 1007    - Atypical squamous cells of undetermined significance (ASC-US)   DIAGPAP  08/08/2020 1045    - Negative for  Intraepithelial Lesions or Malignancy (NILM)   DIAGPAP - Benign reactive/reparative changes 08/08/2020 1045   HPVHIGH Negative 08/20/2022 1126   HPVHIGH Negative 08/14/2021 1007   ADEQPAP  08/20/2022 1126    Satisfactory for evaluation; transformation zone component PRESENT.   ADEQPAP  08/14/2021 1007    Satisfactory for evaluation; transformation zone component PRESENT.   ADEQPAP  08/08/2020 1045    Satisfactory for evaluation; transformation zone component PRESENT.     H/O abnormal pap: yes Last mammogram: n/a.  Last colonoscopy: n/a.      08/23/2023   10:51 AM  Depression screen PHQ 2/9  Decreased Interest 0  Down, Depressed, Hopeless 0  PHQ - 2 Score 0  Altered sleeping 0  Tired, decreased energy 0  Change in appetite 0  Feeling bad or failure about yourself  0  Trouble concentrating 0  Moving slowly or fidgety/restless 0  Suicidal thoughts 0  PHQ-9 Score 0        08/23/2023   10:52 AM  GAD 7 : Generalized Anxiety Score  Nervous, Anxious, on Edge 0  Control/stop worrying 0  Worry too much - different things 0  Trouble relaxing 0  Restless 0  Easily annoyed or irritable 0  Afraid - awful might happen 0  Total GAD 7 Score 0     Review of Systems:   Pertinent items are noted in HPI Denies any headaches, blurred vision, fatigue, shortness of breath, chest pain, abdominal pain, abnormal vaginal discharge/itching/odor/irritation, problems  with periods, bowel movements, urination, or intercourse unless otherwise stated above. Pertinent History Reviewed:  Reviewed past medical,surgical, social and family history.  Reviewed problem list, medications and allergies. Physical Assessment:   Vitals:   08/23/23 1048  BP: 139/74  Pulse: 95  Weight: 163 lb (73.9 kg)  Height: 5\' 3"  (1.6 m)  Body mass index is 28.87 kg/m.        Physical Examination:   General appearance - well appearing, and in no distress  Mental status - alert, oriented to person, place, and  time  Psych:  She has a normal mood and affect  Skin - warm and dry, normal color, no suspicious lesions noted  Chest - effort normal, all lung fields clear to auscultation bilaterally  Heart - normal rate and regular rhythm  Breasts - breasts appear normal, no suspicious masses, no skin or nipple changes or  axillary nodes  Abdomen - soft, nondistended, no masses or organomegaly  Pelvic -  VULVA: normal appearing vulva with no masses, tenderness or lesions   VAGINA: normal appearing vagina with normal color and discharge, no lesions   CERVIX: normal appearing cervix without discharge or lesions, no CMT  Thin prep pap is done with HR HPV cotesting Normal appearing vulva Normal vulvar sensation bilaterally Nontender superficial pelvic floor muscles Nontender ischial tuberosities bilaterally  Allodynia at introitus: No  Anal wink not present Posterior vaginal wall nontender Right levator ani 1/10 Right ischiococcygeous 1/10 Right obturator internus 1/10 Left levator ani 1/10 Left ischioccocygeous 2/10 Left obturator internus 2/10 Anterior vaginal wall nontender Uterine nontender  Extremities:  No swelling or varicosities noted  Chaperone present for exam  No results found for this or any previous visit (from the past 24 hours).    Assessment & Plan:   1. Well woman exam with routine gynecological exam (Primary) - Cervical cancer screening: Discussed screening Q3 years. Reviewed importance of annual exams and limits of pap smear. Pap with reflex HPV collected per patient request - GC/CT: Discussed and recommended. Pt  accepts - Birth Control:  s/p TL - Breast Health: Encouraged self breast awareness/exams.  - Follow-up: 12 months and prn  2. Screening examination for STI - Hepatitis B surface antigen - Hepatitis C antibody - HIV Antibody (routine testing w rflx) - RPR  3. Screening for cervical cancer - Cytology - PAP  4. Dysmenorrhea - Follicle stimulating  hormone - Hemoglobin A1c - Testosterone,Free and Total - TSH Rfx on Abnormal to Free T4  5. Hirsutism Possibly due to PCOS. Will get BMP in case of starting spironolactone to manage. Noted that typically treat PCOS with menstrual regulation or ovulation induction, can also consider cyclic provera to achieve at least 4 menses a year. Patient declines hormonal intervention. Discussed risk of endometrial hyperplasia if prolonged episodes without menses.  - Basic Metabolic Panel (BMET)  6. Dyspareunia, female Mild abdominopelvic myalgia. Unclear if all pain due to MSK given how mild exam findings are. Recommend trial of PFPT - Ambulatory referral to Physical Therapy  7. Generalized abdominal pain Recommend continue follow up with GI. Unclear if abdominal pain could be due to endometriosis as complaint is primarily non-cyclical. Noted that endometriosis is technically diagnosed via laparoscopy and in any case, treatment is ovarian suppression, which she declines at this time    Orders Placed This Encounter  Procedures   Hepatitis B surface antigen   Hepatitis C antibody   HIV Antibody (routine testing w rflx)   RPR   Follicle stimulating  hormone   Hemoglobin A1c   Testosterone,Free and Total   TSH Rfx on Abnormal to Free T4   Basic Metabolic Panel (BMET)   Ambulatory referral to Physical Therapy    Meds: No orders of the defined types were placed in this encounter.   Follow-up: No follow-ups on file.  Lorriane Shire, MD 08/23/2023 10:56 AM

## 2023-08-24 LAB — HIV ANTIBODY (ROUTINE TESTING W REFLEX): HIV Screen 4th Generation wRfx: NONREACTIVE

## 2023-08-24 LAB — HEPATITIS B SURFACE ANTIGEN: Hepatitis B Surface Ag: NEGATIVE

## 2023-08-24 LAB — HEPATITIS C ANTIBODY: Hep C Virus Ab: NONREACTIVE

## 2023-08-24 LAB — RPR: RPR Ser Ql: NONREACTIVE

## 2023-08-25 ENCOUNTER — Encounter: Payer: Self-pay | Admitting: Obstetrics and Gynecology

## 2023-08-27 ENCOUNTER — Other Ambulatory Visit: Payer: Self-pay | Admitting: Obstetrics and Gynecology

## 2023-08-27 DIAGNOSIS — B3731 Acute candidiasis of vulva and vagina: Secondary | ICD-10-CM

## 2023-08-27 LAB — CYTOLOGY - PAP
Adequacy: ABSENT
Chlamydia: NEGATIVE
Comment: NEGATIVE
Comment: NEGATIVE
Comment: NEGATIVE
Comment: NORMAL
Diagnosis: NEGATIVE
Diagnosis: REACTIVE
High risk HPV: NEGATIVE
Neisseria Gonorrhea: NEGATIVE
Trichomonas: NEGATIVE

## 2023-08-27 MED ORDER — FLUCONAZOLE 150 MG PO TABS
150.0000 mg | ORAL_TABLET | Freq: Once | ORAL | 1 refills | Status: DC
Start: 2023-08-27 — End: 2023-10-25

## 2023-09-02 NOTE — Addendum Note (Signed)
Addended by: Harlon Ditty on: 09/02/2023 12:04 PM   Modules accepted: Orders

## 2023-09-04 DIAGNOSIS — Z419 Encounter for procedure for purposes other than remedying health state, unspecified: Secondary | ICD-10-CM | POA: Diagnosis not present

## 2023-09-14 ENCOUNTER — Ambulatory Visit (HOSPITAL_BASED_OUTPATIENT_CLINIC_OR_DEPARTMENT_OTHER): Payer: Medicaid Other

## 2023-09-20 ENCOUNTER — Ambulatory Visit (HOSPITAL_BASED_OUTPATIENT_CLINIC_OR_DEPARTMENT_OTHER)
Admission: RE | Admit: 2023-09-20 | Discharge: 2023-09-20 | Disposition: A | Discharge status: Home / Self Care | Payer: Medicaid Other | Source: Ambulatory Visit | Attending: Obstetrics and Gynecology

## 2023-09-20 ENCOUNTER — Encounter: Payer: Self-pay | Admitting: Obstetrics and Gynecology

## 2023-09-20 DIAGNOSIS — N941 Unspecified dyspareunia: Secondary | ICD-10-CM | POA: Diagnosis not present

## 2023-09-20 DIAGNOSIS — R1084 Generalized abdominal pain: Secondary | ICD-10-CM | POA: Diagnosis not present

## 2023-10-05 DIAGNOSIS — Z419 Encounter for procedure for purposes other than remedying health state, unspecified: Secondary | ICD-10-CM | POA: Diagnosis not present

## 2023-10-11 DIAGNOSIS — M25571 Pain in right ankle and joints of right foot: Secondary | ICD-10-CM | POA: Diagnosis not present

## 2023-10-11 DIAGNOSIS — Z0000 Encounter for general adult medical examination without abnormal findings: Secondary | ICD-10-CM | POA: Diagnosis not present

## 2023-10-18 ENCOUNTER — Ambulatory Visit: Payer: Medicaid Other | Admitting: Obstetrics and Gynecology

## 2023-10-18 VITALS — BP 126/88 | HR 110 | Wt 167.0 lb

## 2023-10-18 DIAGNOSIS — N946 Dysmenorrhea, unspecified: Secondary | ICD-10-CM | POA: Diagnosis not present

## 2023-10-18 DIAGNOSIS — G8929 Other chronic pain: Secondary | ICD-10-CM | POA: Diagnosis not present

## 2023-10-18 DIAGNOSIS — R102 Pelvic and perineal pain: Secondary | ICD-10-CM | POA: Diagnosis not present

## 2023-10-18 NOTE — Progress Notes (Signed)
GYNECOLOGY VISIT  Patient name: Cindy Martinez MRN [id number]  Date of birth: 04-Jan-1996 Chief Complaint:   No chief complaint on file.   History:  Bitania Shankland is a 28 y.o. G2P2001 being seen today for pelvic pain.  Discussed the use of AI scribe software for clinical note transcription with the patient, who gave verbal consent to proceed.  History of Present Illness   Ashara Lounsbury is a 28 year old female who presents with chronic abdominal and pelvic pain, suspected to be related to endometriosis.  She has been experiencing chronic abdominal and pelvic pain for approximately two years, described as a persistent 'tugging' and 'pressure' under the rib cage, making it difficult to breathe. Despite multiple evaluations, including ultrasounds, CT scans, and an endoscopy with biopsies, all results have been normal. She experiences severe menstrual cramps that persist throughout the month, not just during her period. Her pelvic area and lower back feel 'on fire' during her menstrual cycle, with pain radiating to her legs. These symptoms have been present for several years and have become more noticeable and painful recently.  She reports heavy menstrual bleeding, requiring tampon changes every two hours, which is a change from her previous pattern. She has a history of a LEEP procedure and mentions a small cyst on her cervix, which is benign and not a concern unless it changes in size. She has had her tubes removed and has been informed that there might be scar tissue causing her symptoms. Despite seeing multiple doctors and undergoing various tests, she has not received a definitive diagnosis or relief from her symptoms.  She has a history of stage three liver steatosis, which improved to zero after weight loss. She no longer believes her abdominal pain is related to her liver as her liver function is now normal.  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  No issues with ibuprofen, Tylenol, or oxycodone, and no problems with anesthesia in the past. She has an allergy to meloxicam.         Past Medical History:  Diagnosis Date   Hypertension    Vaginal Pap smear, abnormal     Past Surgical History:  Procedure Laterality Date   CARPAL TUNNEL RELEASE     LEEP N/A    TONSILLECTOMY     TUBAL LIGATION      The following portions of the patient's history were reviewed and updated as appropriate: allergies, current medications, past family history, past medical history, past social history, past surgical history and problem list.   Health Maintenance:   Last pap     Component Value Date/Time   DIAGPAP  08/23/2023 1127    - Negative for Intraepithelial Lesions or Malignancy (NILM)   DIAGPAP - Benign reactive/reparative changes 08/23/2023 1127   DIAGPAP  08/20/2022 1126    - Negative for intraepithelial lesion or malignancy (NILM)   HPVHIGH Negative 08/23/2023 1127   HPVHIGH Negative 08/20/2022 1126   HPVHIGH Negative 08/14/2021 1007   ADEQPAP  08/23/2023 1127    Satisfactory for evaluation; transformation zone component ABSENT.   ADEQPAP  08/20/2022 1126    Satisfactory for evaluation; transformation zone component PRESENT.   ADEQPAP  08/14/2021 1007    Satisfactory for evaluation; transformation zone component PRESENT.    High Risk HPV: Positive  Adequacy:  Satisfactory for evaluation, transformation zone component PRESENT  Diagnosis:  Atypical squamous cells of undetermined significance (ASC-US)  Last mammogram: n/a  Review of Systems:  Pertinent items are noted in HPI. Comprehensive review of systems was otherwise negative.   Objective:  Physical Exam BP 126/88 (BP Location: Left Arm, Patient Position: Sitting, Cuff Size: Normal)   Pulse (!) 110   Wt 167 lb (75.8 kg)   LMP 10/07/2023 (Exact Date)   BMI 29.58 kg/m    Physical Exam   Labs and Imaging US PELVIC COMPLETE WITH  TRANSVAGINAL Result Date: 09/20/2023 CLINICAL DATA:  Dyspareunia for 1 year. EXAM: TRANSABDOMINAL AND TRANSVAGINAL ULTRASOUND OF PELVIS TECHNIQUE: Both transabdominal and transvaginal ultrasound examinations of the pelvis were performed. Transabdominal technique was performed for global imaging of the pelvis including uterus, ovaries, adnexal regions, and pelvic cul-de-sac. It was necessary to proceed with endovaginal exam following the transabdominal exam to visualize the endometrium ovaries and uterus. COMPARISON:  July 30, 2022 FINDINGS: Uterus Measurements: 10 x 5.1 x 6.3 cm = volume: 170 mL. No fibroids or other mass visualized. Endometrium Thickness: 11.3 mm.  No focal abnormality visualized. Right ovary Measurements: 4.8 x 2.6 x 2.4 cm = volume: 15.3 mL. Normal appearance/no adnexal mass. Left ovary Measurements: 3.6 x 1.9 x 2.7 cm = volume: 9.4 mL. Normal appearance/no adnexal mass. Other findings Trace fluid is noted in the cervical canal. IMPRESSION: No acute abnormality identified. Electronically Signed   By: Sherian Rein M.D.   On: 09/20/2023 11:47       Assessment & Plan:   Assessment and Plan    Chronic Pelvic Pain and Abnormal Uterine Bleeding Persistent pain under the rib cage and pelvic area for the past two years. Pain is associated with heavy menstrual bleeding and cramping throughout the menstrual cycle. Previous imaging results were normal. Patient has a history of tubal ligation and stage 3 steatosis of the liver, which has resolved with weight loss. Patient concerned for possible endometriosis in upper abdomen due to all negative testing -Plan for laparoscopy to investigate for possible endometriosis and other causes of chronic pelvic pain. -Noted that there can be microscopic endometriosis that is not seen as well as no evidence of intra-abdominal pathology for upper abdominal pain -Plan for hysteroscopy to obtain a sample from inside the uterus to rule out endometritis or  polyps.  Cervical Cyst Patient reports a small cyst on the cervix, likely a Nabothian cyst. -No intervention planned for the Nabothian cyst as it is a benign condition and removal can cause more harm than benefit.  Surgical Risks Discussed the risks of surgery including bleeding, infection, injury to other pelvic organs, and the need for possible blood transfusion or resuscitation measures. -Obtained consent for surgery, blood transfusion, and resuscitation measures if necessary.  Postoperative Plan Discussed the expected recovery time and potential need for further treatment depending on the findings during surgery. -Plan for surgical scheduler to contact the patient with a surgery date. -Follow-up postoperatively to assess recovery and discuss further treatment options if necessary.      Patient desires surgical management with diagnostic laparoscopy and hysteroscopy.  The risks of surgery were discussed in detail with the patient including but not limited to: bleeding which may require transfusion or reoperation; infection which may require prolonged hospitalization or re-hospitalization and antibiotic therapy; injury to bowel, bladder, ureters and major vessels or other surrounding organs which may lead to other procedures; formation of adhesions; need for additional procedures including laparotomy or subsequent procedures secondary to intraoperative injury or abnormal pathology; thromboembolic phenomenon; incisional problems and other postoperative or anesthesia complications.  Patient was told that the  likelihood that her condition and symptoms will be treated effectively with this surgical management was moderate; the postoperative expectations were also discussed in detail. The patient also understands the alternative treatment options which were discussed in full. All questions were answered.  She was told that she will be contacted by our surgical scheduler regarding the time and date of  her surgery; routine preoperative instructions will be given to her by the preoperative nursing team.    Printed patient education handouts about the procedure were given to the patient to review at home.   Routine preventative health maintenance measures emphasized.  Lorriane Shire, MD [facility] for [facility], [facility]

## 2023-10-22 ENCOUNTER — Encounter: Payer: Self-pay | Admitting: Obstetrics and Gynecology

## 2023-10-25 ENCOUNTER — Other Ambulatory Visit: Payer: Self-pay | Admitting: Obstetrics and Gynecology

## 2023-10-25 DIAGNOSIS — B3731 Acute candidiasis of vulva and vagina: Secondary | ICD-10-CM

## 2023-11-02 DIAGNOSIS — Z419 Encounter for procedure for purposes other than remedying health state, unspecified: Secondary | ICD-10-CM | POA: Diagnosis not present

## 2023-11-03 DIAGNOSIS — R3915 Urgency of urination: Secondary | ICD-10-CM | POA: Diagnosis not present

## 2023-11-03 DIAGNOSIS — M549 Dorsalgia, unspecified: Secondary | ICD-10-CM | POA: Diagnosis not present

## 2023-11-03 DIAGNOSIS — Z87440 Personal history of urinary (tract) infections: Secondary | ICD-10-CM | POA: Diagnosis not present

## 2023-11-07 ENCOUNTER — Telehealth: Payer: Self-pay

## 2023-11-07 NOTE — Telephone Encounter (Signed)
 Patient called inquiring about when she will hear from our surgery scheduler about scheduling her surgery. Provided patient with the contact information for scheduling and also informed her that the schedule is limited due to construction at [employer]. Patient voiced understanding.   Routing this to Dejuana to make her aware of the conversation

## 2023-11-09 DIAGNOSIS — E6609 Other obesity due to excess calories: Secondary | ICD-10-CM | POA: Diagnosis not present

## 2023-11-09 DIAGNOSIS — T3695XA Adverse effect of unspecified systemic antibiotic, initial encounter: Secondary | ICD-10-CM | POA: Diagnosis not present

## 2023-11-09 DIAGNOSIS — Z3200 Encounter for pregnancy test, result unknown: Secondary | ICD-10-CM | POA: Diagnosis not present

## 2023-11-09 DIAGNOSIS — Z1339 Encounter for screening examination for other mental health and behavioral disorders: Secondary | ICD-10-CM | POA: Diagnosis not present

## 2023-11-09 DIAGNOSIS — Z6830 Body mass index (BMI) 30.0-30.9, adult: Secondary | ICD-10-CM | POA: Diagnosis not present

## 2023-11-09 DIAGNOSIS — F909 Attention-deficit hyperactivity disorder, unspecified type: Secondary | ICD-10-CM | POA: Diagnosis not present

## 2023-11-09 DIAGNOSIS — Z79899 Other long term (current) drug therapy: Secondary | ICD-10-CM | POA: Diagnosis not present

## 2023-11-09 DIAGNOSIS — Z1331 Encounter for screening for depression: Secondary | ICD-10-CM | POA: Diagnosis not present

## 2023-11-09 DIAGNOSIS — B379 Candidiasis, unspecified: Secondary | ICD-10-CM | POA: Diagnosis not present

## 2023-11-15 DIAGNOSIS — Z79899 Other long term (current) drug therapy: Secondary | ICD-10-CM | POA: Diagnosis not present

## 2023-11-18 ENCOUNTER — Ambulatory Visit: Payer: Medicaid Other | Admitting: Physical Therapy

## 2023-11-29 DIAGNOSIS — F909 Attention-deficit hyperactivity disorder, unspecified type: Secondary | ICD-10-CM | POA: Diagnosis not present

## 2023-11-29 DIAGNOSIS — E6609 Other obesity due to excess calories: Secondary | ICD-10-CM | POA: Diagnosis not present

## 2023-11-29 DIAGNOSIS — Z79899 Other long term (current) drug therapy: Secondary | ICD-10-CM | POA: Diagnosis not present

## 2023-11-29 DIAGNOSIS — Z3200 Encounter for pregnancy test, result unknown: Secondary | ICD-10-CM | POA: Diagnosis not present

## 2023-11-29 DIAGNOSIS — N3001 Acute cystitis with hematuria: Secondary | ICD-10-CM | POA: Diagnosis not present

## 2023-12-06 ENCOUNTER — Telehealth: Payer: Self-pay

## 2023-12-06 NOTE — Telephone Encounter (Signed)
 Reached out to schedule surgery w/ Dr. Briscoe Deutscher. Patient asked to be scheduled on 02/18/24 @[facility] at 1 pm. Pre-Op instructions and surgery details were discussed. Written confirmation of surgery details are being sent to patient's Mychart acct.

## 2023-12-14 DIAGNOSIS — Z419 Encounter for procedure for purposes other than remedying health state, unspecified: Secondary | ICD-10-CM | POA: Diagnosis not present

## 2023-12-17 ENCOUNTER — Telehealth: Payer: Self-pay

## 2023-12-17 NOTE — Telephone Encounter (Signed)
 I called patient to confirm surgery details for 02/18/24 w/ Dr. Elester Grim. Patient asked to be scheduled at 7:30 am instead of the afternoon. Patient confirmed surgery details and pre-op instructions. All written surgery details will be sent to her Mychart acct.

## 2023-12-27 ENCOUNTER — Other Ambulatory Visit: Payer: Self-pay

## 2023-12-27 ENCOUNTER — Emergency Department (HOSPITAL_BASED_OUTPATIENT_CLINIC_OR_DEPARTMENT_OTHER)
Admission: EM | Admit: 2023-12-27 | Discharge: 2023-12-27 | Disposition: A | Discharge status: Home / Self Care | Attending: Emergency Medicine | Admitting: Emergency Medicine

## 2023-12-27 ENCOUNTER — Emergency Department (HOSPITAL_BASED_OUTPATIENT_CLINIC_OR_DEPARTMENT_OTHER)

## 2023-12-27 ENCOUNTER — Encounter (HOSPITAL_BASED_OUTPATIENT_CLINIC_OR_DEPARTMENT_OTHER): Payer: Self-pay | Admitting: Urology

## 2023-12-27 DIAGNOSIS — R202 Paresthesia of skin: Secondary | ICD-10-CM | POA: Insufficient documentation

## 2023-12-27 DIAGNOSIS — M7989 Other specified soft tissue disorders: Secondary | ICD-10-CM | POA: Diagnosis not present

## 2023-12-27 DIAGNOSIS — R55 Syncope and collapse: Secondary | ICD-10-CM | POA: Insufficient documentation

## 2023-12-27 DIAGNOSIS — R42 Dizziness and giddiness: Secondary | ICD-10-CM | POA: Diagnosis present

## 2023-12-27 LAB — CBC WITH DIFFERENTIAL/PLATELET
Abs Immature Granulocytes: 0.02 10*3/uL (ref 0.00–0.07)
Basophils Absolute: 0 10*3/uL (ref 0.0–0.1)
Basophils Relative: 0 %
Eosinophils Absolute: 0 10*3/uL (ref 0.0–0.5)
Eosinophils Relative: 0 %
HCT: 42.9 % (ref 36.0–46.0)
Hemoglobin: 14.8 g/dL (ref 12.0–15.0)
Immature Granulocytes: 0 %
Lymphocytes Relative: 25 %
Lymphs Abs: 2.4 10*3/uL (ref 0.7–4.0)
MCH: 28.9 pg (ref 26.0–34.0)
MCHC: 34.5 g/dL (ref 30.0–36.0)
MCV: 83.8 fL (ref 80.0–100.0)
Monocytes Absolute: 0.5 10*3/uL (ref 0.1–1.0)
Monocytes Relative: 6 %
Neutro Abs: 6.5 10*3/uL (ref 1.7–7.7)
Neutrophils Relative %: 69 %
Platelets: 390 10*3/uL (ref 150–400)
RBC: 5.12 MIL/uL — ABNORMAL HIGH (ref 3.87–5.11)
RDW: 12.6 % (ref 11.5–15.5)
WBC: 9.4 10*3/uL (ref 4.0–10.5)
nRBC: 0 % (ref 0.0–0.2)

## 2023-12-27 LAB — BASIC METABOLIC PANEL WITH GFR
Anion gap: 15 (ref 5–15)
BUN: 8 mg/dL (ref 6–20)
CO2: 22 mmol/L (ref 22–32)
Calcium: 9.7 mg/dL (ref 8.9–10.3)
Chloride: 102 mmol/L (ref 98–111)
Creatinine, Ser: 0.74 mg/dL (ref 0.44–1.00)
GFR, Estimated: >60 mL/min (ref >60)
Glucose, Bld: 81 mg/dL (ref 70–99)
Potassium: 3.9 mmol/L (ref 3.5–5.1)
Sodium: 138 mmol/L (ref 135–145)

## 2023-12-27 LAB — HCG, SERUM, QUALITATIVE: Preg, Serum: NEGATIVE

## 2023-12-27 NOTE — ED Triage Notes (Signed)
 Left arm swelling in forearm with knot x 2 months that comes and goes  Randomly bruises and hand/arm gets cold sensation  Fell down the stairs last week as well  Has bruising to right arm and back bruising

## 2023-12-27 NOTE — Discharge Instructions (Addendum)
 Please read and follow all provided instructions.  Your diagnoses today include:  1. Left arm swelling   2. Syncope, unspecified syncope type   3. Paresthesia     Tests performed today include: Complete blood cell count: normal white blood cells and red blood cells Basic metabolic panel: Normal Pregnancy test (urine or blood, in women only): negative EKG no concerning findings or abnormal heart rhythms Ultrasound of your arm: No signs of blood clot, the area of swelling appears to be most consistent with a hematoma at this time Vital signs. See below for your results today.   Medications prescribed:  None  Take any prescribed medications only as directed.  Home care instructions:  Follow any educational materials contained in this packet.  Apply warm compresses to any sore areas.  Follow-up instructions: Please follow-up with your orthopedist in the next 7 days for further evaluation of your symptoms.   Return instructions:  Please return to the Emergency Department if you experience worsening symptoms.  Please return if you have any other emergent concerns.  Additional Information:  Your vital signs today were: BP (!) 138/94 (BP Location: Left Arm)   Pulse (!) 106   Temp 98.9 F (37.2 C) (Oral)   Resp 16   Ht 5\' 3"  (1.6 m)   Wt 75.8 kg   SpO2 100%   BMI 29.60 kg/m  If your blood pressure (BP) was elevated above 135/85 this visit, please have this repeated by your doctor within one month. --------------

## 2023-12-27 NOTE — ED Notes (Signed)
 Patient transported to Ultrasound

## 2023-12-27 NOTE — ED Provider Notes (Signed)
 [place] [facility] Provider Note   CSN: [id number] Arrival date & time: 12/27/23  1751     History  Chief Complaint  Patient presents with   Arm Swelling    Cindy Martinez is a 28 y.o. female.  Patient with history of carpal tunnel surgery on the right wrist, presents to the emergency department for evaluation of intermittent left forearm swelling over the past 1 year.  Patient recently had a syncopal episode about 1 week ago while in her garage.  She fell to the ground and sustained bruising to her right arm.  She also sustained small areas to the left side.  She has had a lump that is come up on the arm that is mildly tender.  She has had intermittent paresthesias to the forearm for a long time but worse recently.  Family and patient concerned about a blood clot.  No chest pain or shortness of breath.  She is having intermittent dizzy spells described as lightheadedness that last for a few seconds.  Only recent syncopal episode was 1 week ago.  She has a Midwife and orthopedist, but has not seen them recently for this problem.  Also with PCP but has not seen for this problem.       Home Medications Prior to Admission medications   Medication Sig Start Date End Date Taking? Authorizing Provider  acetaminophen  (TYLENOL ) 500 MG tablet Take 1,000 mg by mouth every 6 (six) hours as needed for mild pain. Patient not taking: Reported on 07/16/2022    [provider]  amphetamine-dextroamphetamine (ADDERALL) 20 MG tablet Take 20 mg by mouth 2 (two) times daily. 08/05/23   [provider]  busPIRone  (BUSPAR ) 15 MG tablet Take 15 mg by mouth 3 (three) times daily as needed (anxiety). Patient not taking: Reported on 07/16/2022    [provider]  cefdinir  (OMNICEF ) 300 MG capsule Take 1 capsule (300 mg total) by mouth 2 (two) times daily. Patient not taking: Reported on 10/18/2023 12/18/22   Kelsey Patricia, MD  celecoxib  (CELEBREX) 200 MG capsule Take 200 mg by mouth daily. Patient not taking: Reported on 07/16/2022    [provider]  clindamycin  (CLEOCIN ) 100 MG vaginal suppository Place 1 suppository (100 mg total) vaginally at bedtime. 01/17/23   Ajewole, Christana, MD  escitalopram (LEXAPRO) 10 MG tablet Take 10 mg by mouth daily as needed (anxiety). Patient not taking: Reported on 07/16/2022    [provider]  fluconazole  (DIFLUCAN ) 150 MG tablet TAKE 1 TABLET (150 MG TOTAL) BY MOUTH ONCE FOR 1 DOSE. MAY REPEAT 3 DAYS LATER IF SYMPTOMS PERSIST 10/25/23   Ajewole, Christana, MD  ondansetron  (ZOFRAN -ODT) 4 MG disintegrating tablet Take 1 tablet (4 mg total) by mouth every 8 (eight) hours as needed for nausea or vomiting. Patient not taking: Reported on 10/18/2023 12/18/22   Kelsey Patricia, MD  traZODone  (DESYREL ) 100 MG tablet Take 100 mg by mouth at bedtime as needed for sleep. Patient not taking: Reported on 07/16/2022    [provider]  prochlorperazine  (COMPAZINE ) 10 MG tablet Take 1 tablet (10 mg total) by mouth every 6 (six) hours as needed for nausea or vomiting. Patient not taking: Reported on 10/23/2019 10/16/19 03/19/20  Alissa April, MD      Allergies    Ciprofloxacin, Meloxicam, and Omnipaque  [iohexol ]    Review of Systems   Review of Systems  Physical Exam Updated Vital Signs BP (!) 138/94 (BP Location: Left Arm)  Pulse (!) 106   Temp 98.9 F (37.2 C) (Oral)   Resp 16   Ht 5\' 3"  (1.6 m)   Wt 75.8 kg   SpO2 100%   BMI 29.60 kg/m  Physical Exam Vitals and nursing note reviewed.  Constitutional:      Appearance: She is well-developed.  HENT:     Head: Normocephalic and atraumatic.     Right Ear: Tympanic membrane, ear canal and external ear normal.     Left Ear: Tympanic membrane, ear canal and external ear normal.     Nose: Nose normal.     Mouth/Throat:     Pharynx: Uvula midline.  Eyes:     General: Lids are normal.     Extraocular Movements:      Right eye: No nystagmus.     Left eye: No nystagmus.     Conjunctiva/sclera: Conjunctivae normal.     Pupils: Pupils are equal, round, and reactive to light.  Cardiovascular:     Rate and Rhythm: Normal rate and regular rhythm.  Pulmonary:     Effort: Pulmonary effort is normal. No respiratory distress.     Breath sounds: Normal breath sounds.  Abdominal:     Palpations: Abdomen is soft.     Tenderness: There is no abdominal tenderness.  Musculoskeletal:     Cervical back: Normal range of motion and neck supple. No tenderness or bony tenderness.     Right lower leg: No edema.     Left lower leg: No edema.     Comments: Right upper extremity: Full range of motion of the elbow, slight bruising noted to the distal upper arm.  Left upper extremity: Patient with some tenderness over the forearm with minimal edema, patient does have a more focal area with overlying ecchymosis on the mid to distal forearm.  Full range of motion of the elbow, wrist, fingers and hand.  Skin:    General: Skin is warm and dry.  Neurological:     Mental Status: She is alert and oriented to person, place, and time.     GCS: GCS eye subscore is 4. GCS verbal subscore is 5. GCS motor subscore is 6.     Cranial Nerves: No cranial nerve deficit.     Sensory: No sensory deficit.     Motor: No weakness.     Coordination: Coordination normal.     Gait: Gait normal.     Comments: Upper extremity myotomes tested bilaterally:  C5 Shoulder abduction 5/5 C6 Elbow flexion/wrist extension 5/5 C7 Elbow extension 5/5 C8 Finger flexion 5/5 T1 Finger abduction 5/5  Lower extremity myotomes tested bilaterally: L2 Hip flexion 5/5 L3 Knee extension 5/5 L4 Ankle dorsiflexion 5/5 S1 Ankle plantar flexion 5/5      ED Results / Procedures / Treatments   Labs (all labs ordered are listed, but only abnormal results are displayed) Labs Reviewed  CBC WITH DIFFERENTIAL/PLATELET - Abnormal; Notable for the following components:       Result Value   RBC 5.12 (*)    All other components within normal limits  BASIC METABOLIC PANEL WITH GFR  HCG, SERUM, QUALITATIVE    ED ECG REPORT   Date: 12/27/2023  Rate: 86  Rhythm: normal sinus rhythm  QRS Axis: normal  Intervals: normal  ST/T Wave abnormalities: normal  Conduction Disutrbances:none  Narrative Interpretation:   Old EKG Reviewed: changes noted, slower today compared to 12/2022  I have personally reviewed the EKG tracing and agree with the computerized printout  as noted.   Radiology US  Venous Img Upper Left (DVT Study) Result Date: 12/27/2023 CLINICAL DATA:  Arm swelling with paresthesias. EXAM: LEFT UPPER EXTREMITY VENOUS DOPPLER ULTRASOUND TECHNIQUE: Gray-scale sonography with graded compression, as well as color Doppler and duplex ultrasound were performed to evaluate the upper extremity deep venous system from the level of the subclavian vein and including the jugular, axillary, basilic, radial, ulnar and upper cephalic vein. Spectral Doppler was utilized to evaluate flow at rest and with distal augmentation maneuvers. COMPARISON:  None Available. FINDINGS: Contralateral Subclavian Vein: Respiratory phasicity is normal and symmetric with the symptomatic side. No evidence of thrombus. Normal compressibility. Internal Jugular Vein: No evidence of thrombus. Normal compressibility, respiratory phasicity and response to augmentation. Subclavian Vein: No evidence of thrombus. Normal compressibility, respiratory phasicity and response to augmentation. Axillary Vein: No evidence of thrombus. Normal compressibility, respiratory phasicity and response to augmentation. Cephalic Vein: No evidence of thrombus. Normal compressibility, respiratory phasicity and response to augmentation. Basilic Vein: No evidence of thrombus. Normal compressibility, respiratory phasicity and response to augmentation. Brachial Veins: No evidence of thrombus. Normal compressibility, respiratory  phasicity and response to augmentation. Radial Veins: No evidence of thrombus. Normal compressibility, respiratory phasicity and response to augmentation. Ulnar Veins: No evidence of thrombus. Normal compressibility, respiratory phasicity and response to augmentation. Venous Reflux:  None visualized. Other Findings: Within the soft tissues of the left forearm lateral to the area of bruising, hyperechoic heterogeneous area is seen in the superficial soft tissues measuring 1.3 x 0.4 x 0.6 cm. IMPRESSION: 1. No evidence of DVT within the left upper extremity. 2. Hyperechoic heterogeneous area in the superficial soft tissues of the left forearm lateral to the area of bruising measuring 1.3 x 0.4 x 0.6 cm. This may represent a small hematoma. Electronically Signed   By: Tyron Gallon M.D.   On: 12/27/2023 21:11    Procedures Procedures    Medications Ordered in ED Medications - No data to display  ED Course/ Medical Decision Making/ A&P    Patient seen and examined. History obtained directly from patient.   Labs/EKG: Ordered CBC, BMP, pregnancy; EKG.  Imaging: Ordered upper extremity venous Doppler on the left.  Medications/Fluids: None ordered  Most recent vital signs reviewed and are as follows: BP (!) 138/94 (BP Location: Left Arm)   Pulse (!) 106   Temp 98.9 F (37.2 C) (Oral)   Resp 16   Ht 5\' 3"  (1.6 m)   Wt 75.8 kg   SpO2 100%   BMI 29.60 kg/m   Initial impression: Syncopal episode, patient reports intermittent lightheaded spells.  No chest pain or shortness of breath.  Mildly tachycardic here.  Will check EKG.  History of anemia, will check CBC and BMP.  History of tubal ligation but will check pregnancy.  Nonfocal neuro exam.  Patient also concerned in regards to her left upper extremity symptoms.  Given swelling, will rule out DVT, as patient is also concern for this.  I think that this is less likely.  Area of forearm swelling may be a small hematoma.  Ultimately she may need  Ortho follow-up, consideration of EMG if paresthesias continue.  ///  Reassessment performed. Patient appears stable, exam unchanged.  Labs personally reviewed and interpreted including: CBC unremarkable; BMP unremarkable; pregnancy negative.  EKG appears normal without signs of ischemia, prolonged QT, WPW, Brugada syndrome, arrhythmia or block.  Imaging results reviewed including: DVT study of the upper extremity was negative, area of swelling most consistent with small hematoma  at this time.  Reviewed pertinent lab work and imaging with patient at bedside. Questions answered.  Patient concerned because the lump that she has on her arm has been present for several months.  We discussed possible etiologies including epidermoid cyst, fibroma, lipoma.  Do not suspect infectious etiology at this time.  Most current vital signs reviewed and are as follows: BP 132/89 (BP Location: Right Arm)   Pulse 100   Temp 98.9 F (37.2 C) (Oral)   Resp 16   Ht 5\' 3"  (1.6 m)   Wt 75.8 kg   SpO2 99%   BMI 29.60 kg/m   Plan: Discharge to home.   Prescriptions written for: None  Other home care instructions discussed: Warm compresses, OTC anti-inflammatories  ED return instructions discussed: Worsening pain, redness, swelling of the arm, weakness.  Follow-up instructions discussed: Patient encouraged to follow-up with their PCP in 7 days for evaluation of syncope and dizzy spells.  Encouraged orthopedic follow-up in about a week for follow-up of arm paresthesias and swelling.                                Medical Decision Making Amount and/or Complexity of Data Reviewed Labs: ordered.   Patient's primary concern today was left arm swelling.  Patient reports swelling of the arm that has been present for about a year and a small lump which has been present for several months.  She had bruising after a fall about a week ago.  Patient was evaluated with DVT study today which did not show DVT, showed  small hematoma likely.  No indications for further workup in the emergency department.  No concern for arterial compromise.  Patient is established with orthopedics.  She has had history of carpal tunnel on the right side.  This could potentially be contributing.  No concern for CVA or central cause at this time.  In addition, patient has had short-lived transient dizzy spells with an episode of syncope about 7 days ago.  Patient was evaluated with CBC which did not show anemia, BMP which was normal, pregnancy which was negative, and EKG which was essentially normal without concerning findings as above.  Encourage PCP follow-up for this.  Low concern for concerning cardiac etiology at this time.  Do not suspect ACS, PE without chest pain or other typical features.        Final Clinical Impression(s) / ED Diagnoses Final diagnoses:  Left arm swelling  Syncope, unspecified syncope type  Paresthesia    Rx / DC Orders ED Discharge Orders     None         Lyna Sandhoff, PA-C 12/27/23 2258    Owen Blowers P, DO 01/04/24 [id number]

## 2024-01-05 ENCOUNTER — Other Ambulatory Visit: Payer: Self-pay | Admitting: Obstetrics and Gynecology

## 2024-01-05 DIAGNOSIS — B9689 Other specified bacterial agents as the cause of diseases classified elsewhere: Secondary | ICD-10-CM

## 2024-01-06 DIAGNOSIS — N3000 Acute cystitis without hematuria: Secondary | ICD-10-CM | POA: Diagnosis not present

## 2024-01-06 DIAGNOSIS — Z202 Contact with and (suspected) exposure to infections with a predominantly sexual mode of transmission: Secondary | ICD-10-CM | POA: Diagnosis not present

## 2024-01-06 DIAGNOSIS — N760 Acute vaginitis: Secondary | ICD-10-CM | POA: Diagnosis not present

## 2024-01-07 MED ORDER — CLEOCIN 100 MG VA SUPP: 100.0000 mg | Freq: Every day | VAGINAL | 0 refills | Status: DC

## 2024-01-13 DIAGNOSIS — Z419 Encounter for procedure for purposes other than remedying health state, unspecified: Secondary | ICD-10-CM | POA: Diagnosis not present

## 2024-01-16 DIAGNOSIS — N390 Urinary tract infection, site not specified: Secondary | ICD-10-CM | POA: Diagnosis not present

## 2024-01-30 ENCOUNTER — Ambulatory Visit

## 2024-01-30 VITALS — BP 138/80 | HR 100 | Ht 63.0 in | Wt 169.0 lb

## 2024-01-30 DIAGNOSIS — N390 Urinary tract infection, site not specified: Secondary | ICD-10-CM | POA: Diagnosis not present

## 2024-01-30 LAB — POCT URINALYSIS DIPSTICK
Bilirubin, UA: NEGATIVE
Blood, UA: NEGATIVE
Glucose, UA: NEGATIVE
Ketones, UA: NEGATIVE
Nitrite, UA: NEGATIVE
Protein, UA: NEGATIVE
Spec Grav, UA: 1.015 (ref 1.010–1.025)
Urobilinogen, UA: NEGATIVE U/dL — AB
pH, UA: 6 (ref 5.0–8.0)

## 2024-01-30 NOTE — Progress Notes (Signed)
 Patient here for urinary pressure.  Has been treated x 2 this month for UTI and yeast infection.  Results reviewed with patient, Urine culture sent.  Patient is scheduled for Sx in June.  Arrie Lares [employer]

## 2024-01-31 ENCOUNTER — Encounter: Payer: Self-pay | Admitting: Obstetrics and Gynecology

## 2024-01-31 DIAGNOSIS — B3731 Acute candidiasis of vulva and vagina: Secondary | ICD-10-CM

## 2024-02-03 LAB — URINE CULTURE

## 2024-02-03 MED ORDER — FLUCONAZOLE 150 MG PO TABS: 150.0000 mg | ORAL_TABLET | Freq: Once | ORAL | 3 refills | Status: AC

## 2024-02-03 MED ORDER — SULFAMETHOXAZOLE-TRIMETHOPRIM 800-160 MG PO TABS: 1.0000 | ORAL_TABLET | Freq: Two times a day (BID) | ORAL | 0 refills | Status: AC

## 2024-02-03 NOTE — Addendum Note (Signed)
 Addended by: Malka Sea on: 02/03/2024 04:53 PM   Modules accepted: Orders

## 2024-02-10 ENCOUNTER — Encounter (HOSPITAL_COMMUNITY): Payer: Self-pay | Admitting: Obstetrics and Gynecology

## 2024-02-10 NOTE — Progress Notes (Signed)
 Spoke w/ via phone for pre-op interview--- [facility] needs dos----   T&S per surgeon. UPT per anesthesia.      Lab results------ COVID test -----patient states asymptomatic no test needed Arrive at -------0530 NPO after MN NO Solid Food.   Pre-Surgery Ensure or G2:  Med rec completed Medications to take morning of surgery -----NONE Diabetic medication -----  GLP1 agonist last dose: GLP1 instructions:  Patient instructed no nail polish to be worn day of surgery Patient instructed to bring photo id and insurance card day of surgery Patient aware to have Driver (ride ) / caregiver    for 24 hours after surgery - Fiance Cindy Martinez  Patient Special Instructions -----Shower with antibacterial soap. Pre-Op special Instructions -----  Patient verbalized understanding of instructions that were given at this phone interview. Patient denies chest pain, sob, fever, cough at the interview.

## 2024-02-13 DIAGNOSIS — Z419 Encounter for procedure for purposes other than remedying health state, unspecified: Secondary | ICD-10-CM | POA: Diagnosis not present

## 2024-02-14 DIAGNOSIS — Z6829 Body mass index (BMI) 29.0-29.9, adult: Secondary | ICD-10-CM | POA: Diagnosis not present

## 2024-02-14 DIAGNOSIS — Z79899 Other long term (current) drug therapy: Secondary | ICD-10-CM | POA: Diagnosis not present

## 2024-02-14 DIAGNOSIS — R399 Unspecified symptoms and signs involving the genitourinary system: Secondary | ICD-10-CM | POA: Diagnosis not present

## 2024-02-14 DIAGNOSIS — F909 Attention-deficit hyperactivity disorder, unspecified type: Secondary | ICD-10-CM | POA: Diagnosis not present

## 2024-02-17 ENCOUNTER — Encounter (HOSPITAL_COMMUNITY): Payer: Self-pay | Admitting: Obstetrics and Gynecology

## 2024-02-17 NOTE — Anesthesia Preprocedure Evaluation (Signed)
 Anesthesia Evaluation  Patient identified by MRN, date of birth, ID band Patient awake    Reviewed: Allergy & Precautions, NPO status , Patient's Chart, lab work & pertinent test results  Airway Mallampati: I  TM Distance: >3 FB Neck ROM: Full    Dental  (+) Teeth Intact, Dental Advisory Given   Pulmonary Current SmokerPatient did not abstain from smoking. Vapes heavily- last this am 0500   Pulmonary exam normal breath sounds clear to auscultation       Cardiovascular hypertension, Normal cardiovascular exam Rhythm:Regular Rate:Normal  Hx/o Pre Eclampsia   Neuro/Psych  PSYCHIATRIC DISORDERS Anxiety     negative neurological ROS     GI/Hepatic negative GI ROS, Neg liver ROS,,,  Endo/Other  negative endocrine ROS    Renal/GU Renal diseaseHx/o renal calculi  negative genitourinary   Musculoskeletal negative musculoskeletal ROS (+)    Abdominal   Peds  Hematology negative hematology ROS (+)   Anesthesia Other Findings   Reproductive/Obstetrics (+) Pregnancy Dysmenorrhea Pelvic pain                              Anesthesia Physical Anesthesia Plan  ASA: 2  Anesthesia Plan: General   Post-op Pain Management: Minimal or no pain anticipated, Precedex, Tylenol  PO (pre-op)* and Dilaudid IV   Induction: Intravenous  PONV Risk Score and Plan: 4 or greater and Treatment may vary due to age or medical condition, Midazolam, Ondansetron , Dexamethasone  and Scopolamine patch - Pre-op  Airway Management Planned: Oral ETT  Additional Equipment: None  Intra-op Plan:   Post-operative Plan: Extubation in OR  Informed Consent: I have reviewed the patients History and Physical, chart, labs and discussed the procedure including the risks, benefits and alternatives for the proposed anesthesia with the patient or authorized representative who has indicated his/her understanding and acceptance.      Dental advisory given  Plan Discussed with: CRNA and Anesthesiologist  Anesthesia Plan Comments:         Anesthesia Quick Evaluation

## 2024-02-18 ENCOUNTER — Encounter (HOSPITAL_COMMUNITY)
Admission: RE | Disposition: A | Discharge status: Home / Self Care | Payer: Self-pay | Source: Home / Self Care | Attending: Obstetrics and Gynecology

## 2024-02-18 ENCOUNTER — Other Ambulatory Visit: Payer: Self-pay

## 2024-02-18 ENCOUNTER — Encounter (HOSPITAL_COMMUNITY): Payer: Self-pay | Admitting: Obstetrics and Gynecology

## 2024-02-18 ENCOUNTER — Ambulatory Visit (HOSPITAL_COMMUNITY)
Admission: RE | Admit: 2024-02-18 | Discharge: 2024-02-18 | Disposition: A | Discharge status: Home / Self Care | Attending: Obstetrics and Gynecology | Admitting: Obstetrics and Gynecology

## 2024-02-18 ENCOUNTER — Ambulatory Visit (HOSPITAL_COMMUNITY): Payer: Self-pay | Admitting: Anesthesiology

## 2024-02-18 ENCOUNTER — Other Ambulatory Visit (HOSPITAL_COMMUNITY): Payer: Self-pay

## 2024-02-18 DIAGNOSIS — R102 Pelvic and perineal pain: Secondary | ICD-10-CM | POA: Diagnosis not present

## 2024-02-18 DIAGNOSIS — N946 Dysmenorrhea, unspecified: Secondary | ICD-10-CM | POA: Diagnosis not present

## 2024-02-18 DIAGNOSIS — N80329 Endometriosis of the posterior cul-de-sac, unspecified depth: Secondary | ICD-10-CM | POA: Diagnosis not present

## 2024-02-18 DIAGNOSIS — N840 Polyp of corpus uteri: Secondary | ICD-10-CM | POA: Insufficient documentation

## 2024-02-18 DIAGNOSIS — I10 Essential (primary) hypertension: Secondary | ICD-10-CM | POA: Insufficient documentation

## 2024-02-18 DIAGNOSIS — K654 Sclerosing mesenteritis: Secondary | ICD-10-CM | POA: Diagnosis not present

## 2024-02-18 DIAGNOSIS — N80351 Endometriosis of the right pelvic sidewall, unspecified depth: Secondary | ICD-10-CM | POA: Diagnosis not present

## 2024-02-18 DIAGNOSIS — I878 Other specified disorders of veins: Secondary | ICD-10-CM | POA: Insufficient documentation

## 2024-02-18 DIAGNOSIS — N944 Primary dysmenorrhea: Secondary | ICD-10-CM | POA: Diagnosis not present

## 2024-02-18 DIAGNOSIS — N803C1 Endometriosis of the right uterosacral ligament, unspecified depth: Secondary | ICD-10-CM | POA: Diagnosis not present

## 2024-02-18 DIAGNOSIS — N8000 Endometriosis of the uterus, unspecified: Secondary | ICD-10-CM | POA: Diagnosis not present

## 2024-02-18 DIAGNOSIS — Z01818 Encounter for other preprocedural examination: Secondary | ICD-10-CM

## 2024-02-18 DIAGNOSIS — N803C2 Endometriosis of the left uterosacral ligament, unspecified depth: Secondary | ICD-10-CM | POA: Diagnosis not present

## 2024-02-18 DIAGNOSIS — K660 Peritoneal adhesions (postprocedural) (postinfection): Secondary | ICD-10-CM | POA: Diagnosis not present

## 2024-02-18 HISTORY — PX: LAPAROSCOPY: SHX197

## 2024-02-18 HISTORY — DX: Personal history of urinary calculi: Z87.442

## 2024-02-18 HISTORY — PX: HYSTEROSCOPY WITH D & C: SHX1775

## 2024-02-18 HISTORY — DX: Attention-deficit hyperactivity disorder, unspecified type: F90.9

## 2024-02-18 HISTORY — DX: Anxiety disorder, unspecified: F41.9

## 2024-02-18 HISTORY — PX: LAPAROSCOPIC LYSIS OF ADHESIONS: SHX5905

## 2024-02-18 LAB — TYPE AND SCREEN
ABO/RH(D): O POS
Antibody Screen: NEGATIVE

## 2024-02-18 LAB — POCT PREGNANCY, URINE: Preg Test, Ur: NEGATIVE

## 2024-02-18 LAB — ABO/RH: ABO/RH(D): O POS

## 2024-02-18 SURGERY — LAPAROSCOPY, DIAGNOSTIC
Anesthesia: General | Site: Vagina

## 2024-02-18 MED ORDER — KETOROLAC TROMETHAMINE 15 MG/ML IJ SOLN
INTRAMUSCULAR | Status: DC | PRN
  Administered 2024-02-18: 15 mg via INTRAVENOUS

## 2024-02-18 MED ORDER — LIDOCAINE 2% (20 MG/ML) 5 ML SYRINGE
INTRAMUSCULAR | Status: AC
  Filled 2024-02-18: qty 5

## 2024-02-18 MED ORDER — FENTANYL CITRATE (PF) 250 MCG/5ML IJ SOLN
INTRAMUSCULAR | Status: DC | PRN
  Administered 2024-02-18: 100 ug via INTRAVENOUS
  Administered 2024-02-18 (×2): 50 ug via INTRAVENOUS

## 2024-02-18 MED ORDER — IBUPROFEN 600 MG PO TABS
600.0000 mg | ORAL_TABLET | Freq: Four times a day (QID) | ORAL | 0 refills | Status: AC | PRN
  Filled 2024-02-18: qty 90, 23d supply, fill #0

## 2024-02-18 MED ORDER — MIDAZOLAM HCL 2 MG/2ML IJ SOLN
INTRAMUSCULAR | Status: DC | PRN
  Administered 2024-02-18: 2 mg via INTRAVENOUS

## 2024-02-18 MED ORDER — PROPOFOL 10 MG/ML IV BOLUS
INTRAVENOUS | Status: DC | PRN
  Administered 2024-02-18: 150 mg via INTRAVENOUS

## 2024-02-18 MED ORDER — SEPRAFILM FOR OPTIME
ORAL_FILM | TOPICAL | Status: DC | PRN
  Administered 2024-02-18: 2

## 2024-02-18 MED ORDER — ONDANSETRON HCL 4 MG/2ML IJ SOLN
INTRAMUSCULAR | Status: DC | PRN
  Administered 2024-02-18: 4 mg via INTRAVENOUS

## 2024-02-18 MED ORDER — ONDANSETRON HCL 4 MG/2ML IJ SOLN: 4.0000 mg | Freq: Once | INTRAMUSCULAR | Status: DC | PRN

## 2024-02-18 MED ORDER — ROCURONIUM BROMIDE 10 MG/ML (PF) SYRINGE
PREFILLED_SYRINGE | INTRAVENOUS | Status: AC
  Filled 2024-02-18: qty 10

## 2024-02-18 MED ORDER — SCOPOLAMINE 1 MG/3DAYS TD PT72: 1.0000 | MEDICATED_PATCH | TRANSDERMAL | Status: DC

## 2024-02-18 MED ORDER — ONDANSETRON HCL 4 MG/2ML IJ SOLN
INTRAMUSCULAR | Status: AC
  Filled 2024-02-18: qty 2

## 2024-02-18 MED ORDER — BUPIVACAINE HCL (PF) 0.5 % IJ SOLN
INTRAMUSCULAR | Status: DC | PRN
  Administered 2024-02-18: 30 mL

## 2024-02-18 MED ORDER — CHLORHEXIDINE GLUCONATE 0.12 % MT SOLN
OROMUCOSAL | Status: AC
  Filled 2024-02-18: qty 15

## 2024-02-18 MED ORDER — LIDOCAINE 2% (20 MG/ML) 5 ML SYRINGE
INTRAMUSCULAR | Status: DC | PRN
  Administered 2024-02-18: 60 mg via INTRAVENOUS

## 2024-02-18 MED ORDER — SODIUM CHLORIDE 0.9 % IR SOLN
Status: DC | PRN
  Administered 2024-02-18: 3000 mL

## 2024-02-18 MED ORDER — BUPIVACAINE HCL (PF) 0.5 % IJ SOLN
INTRAMUSCULAR | Status: AC
  Filled 2024-02-18: qty 30

## 2024-02-18 MED ORDER — OXYCODONE HCL 5 MG/5ML PO SOLN: 5.0000 mg | Freq: Once | ORAL | Status: AC | PRN

## 2024-02-18 MED ORDER — PROPOFOL 10 MG/ML IV BOLUS
INTRAVENOUS | Status: AC
  Filled 2024-02-18: qty 20

## 2024-02-18 MED ORDER — POVIDONE-IODINE 10 % EX SWAB: 2.0000 | Freq: Once | CUTANEOUS | Status: DC

## 2024-02-18 MED ORDER — ACETAMINOPHEN 500 MG PO TABS
1000.0000 mg | ORAL_TABLET | ORAL | Status: AC
Start: 2024-02-18 — End: 2024-02-18
  Administered 2024-02-18: 1000 mg via ORAL

## 2024-02-18 MED ORDER — OXYCODONE HCL 5 MG PO TABS
ORAL_TABLET | ORAL | Status: AC
  Filled 2024-02-18: qty 1

## 2024-02-18 MED ORDER — ORAL CARE MOUTH RINSE: 15.0000 mL | Freq: Once | OROMUCOSAL | Status: AC

## 2024-02-18 MED ORDER — DEXAMETHASONE SODIUM PHOSPHATE 10 MG/ML IJ SOLN
INTRAMUSCULAR | Status: AC
  Filled 2024-02-18: qty 1

## 2024-02-18 MED ORDER — ACETAMINOPHEN 500 MG PO TABS
500.0000 mg | ORAL_TABLET | Freq: Four times a day (QID) | ORAL | 0 refills | Status: AC | PRN
  Filled 2024-02-18: qty 120, 30d supply, fill #0

## 2024-02-18 MED ORDER — 0.9 % SODIUM CHLORIDE (POUR BTL) OPTIME
TOPICAL | Status: DC | PRN
  Administered 2024-02-18: 1000 mL

## 2024-02-18 MED ORDER — DROPERIDOL 2.5 MG/ML IJ SOLN: 0.6250 mg | Freq: Once | INTRAMUSCULAR | Status: DC | PRN

## 2024-02-18 MED ORDER — SCOPOLAMINE 1 MG/3DAYS TD PT72
MEDICATED_PATCH | TRANSDERMAL | Status: AC
  Administered 2024-02-18: 1.5 mg
  Filled 2024-02-18: qty 1

## 2024-02-18 MED ORDER — MIDAZOLAM HCL 2 MG/2ML IJ SOLN
INTRAMUSCULAR | Status: AC
  Filled 2024-02-18: qty 2

## 2024-02-18 MED ORDER — FENTANYL CITRATE (PF) 250 MCG/5ML IJ SOLN
INTRAMUSCULAR | Status: AC
  Filled 2024-02-18: qty 5

## 2024-02-18 MED ORDER — CHLORHEXIDINE GLUCONATE 0.12 % MT SOLN
15.0000 mL | Freq: Once | OROMUCOSAL | Status: AC
  Administered 2024-02-18: 15 mL via OROMUCOSAL

## 2024-02-18 MED ORDER — LACTATED RINGERS IV SOLN
INTRAVENOUS | Status: DC
Start: 2024-02-18 — End: 2024-02-18

## 2024-02-18 MED ORDER — SUGAMMADEX SODIUM 200 MG/2ML IV SOLN
INTRAVENOUS | Status: DC | PRN
  Administered 2024-02-18: 200 mg via INTRAVENOUS

## 2024-02-18 MED ORDER — ROCURONIUM BROMIDE 10 MG/ML (PF) SYRINGE
PREFILLED_SYRINGE | INTRAVENOUS | Status: DC | PRN
  Administered 2024-02-18: 10 mg via INTRAVENOUS
  Administered 2024-02-18: 20 mg via INTRAVENOUS
  Administered 2024-02-18: 10 mg via INTRAVENOUS
  Administered 2024-02-18: 50 mg via INTRAVENOUS

## 2024-02-18 MED ORDER — PROPOFOL 500 MG/50ML IV EMUL
INTRAVENOUS | Status: DC | PRN
Start: 2024-02-18 — End: 2024-02-18
  Administered 2024-02-18: 190 ug/kg/min via INTRAVENOUS
  Administered 2024-02-18: 175 ug/kg/min via INTRAVENOUS

## 2024-02-18 MED ORDER — OXYCODONE HCL 5 MG PO TABS
5.0000 mg | ORAL_TABLET | Freq: Once | ORAL | Status: AC | PRN
  Administered 2024-02-18: 5 mg via ORAL

## 2024-02-18 MED ORDER — DEXMEDETOMIDINE HCL IN NACL 80 MCG/20ML IV SOLN
INTRAVENOUS | Status: DC | PRN
  Administered 2024-02-18 (×4): 4 ug via INTRAVENOUS

## 2024-02-18 MED ORDER — LACTATED RINGERS IV SOLN: INTRAVENOUS | Status: DC

## 2024-02-18 MED ORDER — DEXAMETHASONE SODIUM PHOSPHATE 10 MG/ML IJ SOLN
INTRAMUSCULAR | Status: DC | PRN
  Administered 2024-02-18: 10 mg via INTRAVENOUS

## 2024-02-18 MED ORDER — ACETAMINOPHEN 500 MG PO TABS
ORAL_TABLET | ORAL | Status: AC
  Filled 2024-02-18: qty 2

## 2024-02-18 MED ORDER — KETOROLAC TROMETHAMINE 30 MG/ML IJ SOLN
INTRAMUSCULAR | Status: AC
  Filled 2024-02-18: qty 1

## 2024-02-18 MED ORDER — OXYCODONE HCL 5 MG PO TABS
5.0000 mg | ORAL_TABLET | ORAL | 0 refills | Status: AC | PRN
  Filled 2024-02-18: qty 10, 2d supply, fill #0

## 2024-02-18 SURGICAL SUPPLY — 48 items
APPLICATOR ARISTA FLEXITIP XL (MISCELLANEOUS) IMPLANT
CATH ROBINSON RED A/P 16FR (CATHETERS) IMPLANT
CNTNR URN SCR LID CUP LEK RST (MISCELLANEOUS) ×2 IMPLANT
COVER MAYO STAND STRL (DRAPES) ×2 IMPLANT
DERMABOND ADVANCED .7 DNX12 (GAUZE/BANDAGES/DRESSINGS) ×2 IMPLANT
DEVICE MYOSURE LITE (MISCELLANEOUS) IMPLANT
DEVICE MYOSURE REACH (MISCELLANEOUS) IMPLANT
DRAPE SURG IRRIG POUCH 19X23 (DRAPES) ×2 IMPLANT
DRAPE TOWEL STERILE LF 18X12 (DRAPES) ×2 IMPLANT
DRSG TELFA 3X8 NADH STRL (GAUZE/BANDAGES/DRESSINGS) IMPLANT
DURAPREP 26ML APPLICATOR (WOUND CARE) ×2 IMPLANT
ELECTRODE REM PT RTRN 9FT ADLT (ELECTROSURGICAL) ×2 IMPLANT
GAUZE 4X4 16PLY ~~LOC~~+RFID DBL (SPONGE) IMPLANT
GLOVE BIO SURGEON STRL SZ7 (GLOVE) ×2 IMPLANT
GLOVE BIOGEL PI IND STRL 7.0 (GLOVE) ×8 IMPLANT
GLOVE ECLIPSE 7.0 STRL STRAW (GLOVE) ×2 IMPLANT
GOWN STRL REUS W/ TWL LRG LVL3 (GOWN DISPOSABLE) ×2 IMPLANT
GOWN STRL REUS W/ TWL XL LVL3 (GOWN DISPOSABLE) ×4 IMPLANT
IRRIGATION SUCT STRKRFLW 2 WTP (MISCELLANEOUS) IMPLANT
KIT PINK PAD W/HEAD ARM REST (MISCELLANEOUS) ×2 IMPLANT
KIT PROCEDURE FLUENT (KITS) ×2 IMPLANT
KIT TURNOVER KIT B (KITS) ×2 IMPLANT
LIGASURE BLUNT TIP 5 LONG 44CM (ELECTROSURGICAL) IMPLANT
MANIFOLD NEPTUNE II (INSTRUMENTS) IMPLANT
NDL INSUFFLATION 14GA 120MM (NEEDLE) IMPLANT
NEEDLE INSUFFLATION 14GA 120MM (NEEDLE) IMPLANT
NS IRRIG 1000ML POUR BTL (IV SOLUTION) ×2 IMPLANT
PACK LAPAROSCOPY BASIN (CUSTOM PROCEDURE TRAY) ×2 IMPLANT
PACK VAGINAL MINOR WOMEN LF (CUSTOM PROCEDURE TRAY) ×2 IMPLANT
PAD OB MATERNITY 11 LF (PERSONAL CARE ITEMS) ×2 IMPLANT
POUCH LAPAROSCOPIC INSTRUMENT (MISCELLANEOUS) ×2 IMPLANT
SEAL CERVICAL OMNI LOK (ABLATOR) IMPLANT
SEAL ROD LENS SCOPE MYOSURE (ABLATOR) ×2 IMPLANT
SEPRAFILM MEMBRANE 5X6 (MISCELLANEOUS) IMPLANT
SET TUBE SMOKE EVAC HIGH FLOW (TUBING) ×2 IMPLANT
SLEEVE SCD COMPRESS KNEE MED (STOCKING) ×2 IMPLANT
SLEEVE Z-THREAD 5X100MM (TROCAR) ×4 IMPLANT
STOPCOCK 4 WAY LG BORE MALE ST (IV SETS) IMPLANT
SUT VIC AB 4-0 PS2 18 (SUTURE) ×2 IMPLANT
SUT VIC AB 4-0 SH 27XANBCTRL (SUTURE) IMPLANT
SYR 50ML LL SCALE MARK (SYRINGE) IMPLANT
SYR CONTROL 10ML LL (SYRINGE) IMPLANT
SYSTEM BAG RETRIEVAL 10MM (BASKET) IMPLANT
SYSTEM RETRIEVL 5MM INZII UNIV (BASKET) IMPLANT
SYSTEM TISS REMOVAL MYOSURE XL (MISCELLANEOUS) IMPLANT
TRAY FOLEY W/BAG SLVR 14FR LF (SET/KITS/TRAYS/PACK) ×2 IMPLANT
TROCAR Z-THREAD FIOS 5X100MM (TROCAR) ×2 IMPLANT
WARMER LAPAROSCOPE (MISCELLANEOUS) ×2 IMPLANT

## 2024-02-18 NOTE — Op Note (Signed)
 Berniece Brisk PROCEDURE DATE: 02/18/2024  PREOPERATIVE DIAGNOSIS: pelvic pain, dysmenorrhea  POSTOPERATIVE DIAGNOSIS: pelvic pain, dysmenorrhea PROCEDURE:    diagnostic laparoscopy, lysis of adhesions, operative hysteroscopy, dilation and curettage SURGEON: Kiki Pelton, MD ASSISTANT:  Kirstie Percy, PA  An experienced assistant was required given the standard of surgical care given the complexity of the case.  This assistant was needed for exposure, dissection, suctioning, retraction, instrument exchange, and for overall help during the procedure.  INDICATIONS: 28 y.o. G2P2001 with dysmenorrhea and pelvic pain.  Risks of surgery were discussed with the patient including but not limited to: bleeding which may require transfusion; infection which may require antibiotics; injury to surrounding organs; need for additional procedures including laparotomy;  and other postoperative/anesthesia complications. Written informed consent was obtained.    FINDINGS:  Normal external genitalia, normal external genitalia, normal appearing cervix Hysteroscopically: polypoid lesion in posterior endometrial cavity, bilateral tubal ostia visualized Laparoscopically: normal upper abdominal survey, filmy adhesions along right side wall, normal appearing uterus with small clear lesion on posterior surface, surgically ligated fallopian tubes with omental adhesions to each, normal bilateral ovaries, bilateral ureters seen, normal anterior cul de sac, scattered whtie lesions within posterior cul de sac with contracted peritoneum on bilateral uterosacral ligaments as well as 3cm phlebolith free from attachement   ANESTHESIA: General, paracervical block INTRAVENOUS FLUIDS:  1000 ml of LR ESTIMATED BLOOD LOSS:  20 ml URINE OUTPUT: 175 ml SPECIMENS: endometrial polyp and curettings, posterior cul de sac biopsies, right uterosacral biopsy, phlebolith, right pelvic side wall biopsy, posterior uterus biopsy, and left  uterosacral biopsy  COMPLICATIONS:  None immediate. FLUID DEFICIT: 260ml of normal saline  PROCEDURE: The patient was taken to the operating room and placed under general anesthesia. SCDs were in place.  Time out was performed. Patient was placed in dorsolithotomy in [place] stirrups. She was prepped and draped in the usual sterile fashion. A foley was placed in sterile fashion and drained clear yellow urine throughout the case. A speculum was placeed in the vagina. The cervix was visualized anteriorly and grasped with a single-tooth tenaculum. Paracervical block was performed with 0.5% bupivicaine with 10 cc injected. Sequential dilation was performed with dilators. The hysteroscope was inserted and the endometrial cavity and inspected. There was a polypoid lesion noted in the endometrial cavity with both ostia seen. The myosure lite was used to resect the lesion. Once the endometrial cavity was adequately resected, the hysteroscope was removed. Sharp curettage was performed in all 4 quadrants. A hulka manipulator was then placed within the uterus. Attention was turned to the abdomen where an umbilical incision was made with the scalpel.  The Optiview 5-mm trocar and sleeve were attempted to be advanced twice without success. At this time, decision made to enter via Palmer's point. Palmer's point was identified, the skin infiltrated with local anesthetic, the skin incised and then the camera with 5mm trocar sleeve was advanced without difficulty under direct visualization into the abdomen.  The abdomen was then insufflated with carbon dioxide gas and adequate pneumoperitoneum was obtained.   A detailed survey of the patient's pelvis and abdomen revealed the findings as mentioned above. The umbilical trocar was then introduced under direct visualization, the patient placed in trendelenburg and an additional trocar introduced in the LLQ under direct visualization. The right side wall was found to have filmy  adhesions involving the colon and appendix and was taken down sharply.  Attention was then turned to the pelvis and survey completed. The peritoneum was elevated and sharply  incised using laparoscopic scissors.  Similarly attention was turned to the right uterosacral, in which the peritoneum was lifted and excised sharply.  Monopolar energy was used to obtain hemostasis.  Attention was turned to the side wall in which additional lesions were taken down medial to the IP ligament.  Attention was turned to the posterior cul-de-sac where the posterior uterine's biopsy was taken.  Additional peritoneum over the posterior cul-de-sac was taken sharply.  At this time the phlebolith was identified, and was disrupted while grasping it.  A 5 mm Endo Catch bag was introduced through the trocar, and the phlebolith placed within the bag and removed through the trocar.  Right pelvic sidewall peritoneal biopsy was then taken.  The left uterosacral biopsy was taken sharply. The operative site was surveyed, and it was found to be hemostatic.  No intraoperative injury to surrounding organs was noted.  Diluted Seprafilm was then instilled within the biopsy areas throughout the pelvic cavity.  Additional saline was abdominal pelvic cavity as an adhesive barrier.  The abdomen was desufflated and all instruments were then removed from the patient's abdomen.  All incisions were closed with Dermabond. The uterine manipulator was removed without complications as well as the foley. The patient tolerated the procedures well.  All instruments, needles, and sponge counts were correct x 2. The patient was taken to the recovery room in stable condition.    Kiki Pelton, MD Minimally Invasive Gynecologic Surgery  Obstetrics and Gynecology, [facility] for [facility], [facility] 02/18/2024

## 2024-02-18 NOTE — Brief Op Note (Signed)
 02/18/2024  9:46 AM  PATIENT:  Cindy Martinez  28 y.o. female  PRE-OPERATIVE DIAGNOSIS:  Dysmenorrhea Pelvic pain  POST-OPERATIVE DIAGNOSIS:  Dysmenorrhea Pelvic pain  PROCEDURE:  Procedure(s) with comments: LAPAROSCOPY, DIAGNOSTIC (N/A) LYSIS, ADHESIONS, LAPAROSCOPIC (N/A) DILATATION AND CURETTAGE /HYSTEROSCOPY (N/A) - w/ polyp resect  SURGEON:  Surgeons and Role:    Kiki Pelton, MD - Primary  PHYSICIAN ASSISTANT: Kirstie Percy, PA  ASSISTANTS: none   ANESTHESIA:   local, general, and paracervical block  EBL:  20 mL   BLOOD ADMINISTERED:none  DRAINS: none   LOCAL MEDICATIONS USED:  BUPIVICAINE   SPECIMEN:  Source of Specimen:  endometrial polyp and curettings, posterior cul de sac biopsies, right uterosacral biopsy, phlebolith, right pelvic side wall biopsy, posterior uterus biopsy, and left uterosacral biopsy   DISPOSITION OF SPECIMEN:  PATHOLOGY  COUNTS:  YES  TOURNIQUET:  * No tourniquets in log *  DICTATION: .Note written in EPIC  PLAN OF CARE: Discharge to home after PACU  PATIENT DISPOSITION:  PACU - hemodynamically stable.   Delay start of Pharmacological VTE agent (>24hrs) due to surgical blood loss or risk of bleeding: not applicable

## 2024-02-18 NOTE — Discharge Instructions (Signed)
Post-surgical Instructions, Outpatient Surgery  You may expect to feel dizzy, weak, and drowsy for as long as 24 hours after receiving the medicine that made you sleep (anesthetic). For the first 24 hours after your surgery:   Do not drive a car, ride a bicycle, participate in physical activities, or take public transportation until you are done taking narcotic pain medicines or as directed by Dr. Briscoe Deutscher.  Do not drink alcohol or take tranquilizers.  Do not take medicine that has not been prescribed by your physicians.  Do not sign important papers or make important decisions while on narcotic pain medicines.  Have a responsible person with you.   CARE OF INCISION If you have a bandage, you may remove it in one day.  If there are steri-strips or dermabond, just let this loosen on its own.  You may shower on the first day after your surgery.  Do not sit in a tub bath for one week. Avoid heavy lifting (more than 10 pounds/4.5 kilograms), pushing, or pulling.  Avoid activities that may risk injury to your incisions.   PAIN MANAGEMENT Ibuprofen 800mg .  (This is the same as 4-200mg  over the counter tablets of Motrin or ibuprofen.)  Take this every 6 hours or as needed for cramping.   Acetaminophen 1000mg  (This is the same as 2-500mg  over the counter extra strength tylenol). Take this every 6 hours for the first 3 days or as needed afterwards for pain Oxycodone 5mg  For more severe pain, take one or two tablets every four to six hours as needed for pain control.  (Remember that narcotic pain medications increase your risk of constipation.  If this becomes a problem, you may take an over the counter laxative like miralax.)  DO'S AND DON'T'S Do not take a tub bath for 2 weeks.  You may shower on the first day after your surgery Do not do any heavy lifting for one to two weeks.  This increases the chance of bleeding. Do move around as you feel able.  Stairs are fine.  You may begin to exercise again as  you feel able.  Do not lift any weights for two weeks. Do not put anything in the vagina for two weeks--no tampons, intercourse, or douching.    REGULAR MEDIATIONS/VITAMINS: You may restart all of your regular medications as prescribed. You may restart all of your vitamins as you normally take them.    PLEASE CALL OR SEEK MEDICAL CARE IF: You have persistent nausea and vomiting.  You have trouble eating or drinking.  You have an oral temperature above 100.5.  You have constipation that is not helped by adjusting diet or increasing fluid intake. Pain medicines are a common cause of constipation.  You have heavy vaginal bleeding You have redness or drainage from your incision(s) or there is increasing pain or tenderness near or in the surgical site.

## 2024-02-18 NOTE — Anesthesia Procedure Notes (Signed)
 Procedure Name: Intubation Date/Time: 02/18/2024 7:53 AM  Performed by: Linard Reno, CRNAPre-anesthesia Checklist: Patient identified, Emergency Drugs available, Suction available and Patient being monitored Patient Re-evaluated:Patient Re-evaluated prior to induction Oxygen Delivery Method: Circle System Utilized Preoxygenation: Pre-oxygenation with 100% oxygen Induction Type: IV induction Ventilation: Mask ventilation without difficulty Laryngoscope Size: Mac and 3 Grade View: Grade I Tube type: Oral Tube size: 7.0 mm Number of attempts: 1 Airway Equipment and Method: Stylet and Oral airway Placement Confirmation: ETT inserted through vocal cords under direct vision, positive ETCO2 and breath sounds checked- equal and bilateral Secured at: 21 cm Tube secured with: Tape Dental Injury: Teeth and Oropharynx as per pre-operative assessment  Comments: Intubation by Darlina Einstein, SRNA.

## 2024-02-18 NOTE — H&P (Signed)
 OB/GYN Pre-Op History and Physical  Cindy Martinez is a 28 y.o. G2P2001 presenting for surgical procedure.       Past Medical History:  Diagnosis Date   ADHD (attention deficit hyperactivity disorder)    Anxiety    History of kidney stones    Hypertension    pre-eclampsia   Vaginal Pap smear, abnormal     Past Surgical History:  Procedure Laterality Date   CARPAL TUNNEL RELEASE Right 2020   LEEP N/A 2020   TONSILLECTOMY     age 31   TUBAL LIGATION  2018    OB History  Gravida Para Term Preterm AB Living  2 2 2   1   SAB IAB Ectopic Multiple Live Births      1    # Outcome Date GA Lbr Len/2nd Weight Sex Type Anes PTL Lv  2 Term 2017 [redacted]w[redacted]d   F Vag-Spont None N LIV  1 Term 2016 [redacted]w[redacted]d   M Vag-Spont None N     Social History   Socioeconomic History   Marital status: Single    Spouse name: Not on file   Number of children: 2   Years of education: Not on file   Highest education level: High school graduate  Occupational History   Not on file  Tobacco Use   Smoking status: Never   Smokeless tobacco: Never  Vaping Use   Vaping status: Every Day   Substances: Nicotine  Substance and Sexual Activity   Alcohol use: Yes    Comment: occ   Drug use: No   Sexual activity: Yes    Birth control/protection: Surgical  Other Topics Concern   Not on file  Social History Narrative   Not on file   Social Drivers of Health   Financial Resource Strain: Not on file  Food Insecurity: No Food Insecurity (07/09/2022)   Hunger Vital Sign    Worried About Running Out of Food in the Last Year: Never true    Ran Out of Food in the Last Year: Never true  Transportation Needs: No Transportation Needs (07/09/2022)   PRAPARE - Administrator, Civil Service (Medical): No    Lack of Transportation (Non-Medical): No  Physical Activity: Not on file  Stress: Not on file  Social Connections: Unknown (09/30/2022)   [facility]   Social Network    Social  Network: Not on file    History reviewed. No pertinent family history.  Medications Prior to Admission  Medication Sig Dispense Refill Last Dose/Taking   amphetamine-dextroamphetamine (ADDERALL XR) 20 MG 24 hr capsule Take 20 mg by mouth daily.   02/17/2024   clindamycin  (CLEOCIN ) 100 MG vaginal suppository Place 1 suppository (100 mg total) vaginally at bedtime. (Patient not taking: Reported on 01/30/2024) 3 suppository 0     Allergies  Allergen Reactions   Ciprofloxacin Shortness Of Breath   Meloxicam Other (See Comments)   Omnipaque  [Iohexol ] Other (See Comments)    Pt developed sneezing and congestion post IV contrast injection, suggest premeds prior to future administration of contrast. NO airway compromise/hives/itching/swelling of tongue or throat    Review of Systems: Negative except for what is mentioned in HPI.     Physical Exam: BP 137/88   Pulse 93   Temp 98.2 F (36.8 C) (Oral)   Resp 16   Ht 5' 3 (1.6 m)   Wt 75.8 kg   LMP 02/11/2024 (Exact Date)   SpO2 99%   BMI 29.58 kg/m  CONSTITUTIONAL: Well-developed, well-nourished and in no acute distress.  HENT:  Normocephalic, atraumatic, External right and left ear normal. Oropharynx is clear and moist EYES: Conjunctivae and EOM are normal. Pupils are equal, round, and reactive to light. No scleral icterus.  NECK: Normal range of motion, supple, no masses SKIN: Skin is warm and dry. No rash noted. Not diaphoretic. No erythema. No pallor. NEUROLGIC: Alert and oriented to person, place, and time. Normal reflexes, muscle tone coordination. No cranial nerve deficit noted. PSYCHIATRIC: Normal mood and affect. Normal behavior. Normal judgment and thought content. RESPIRATORY: Normal effort PELVIC: Deferred   Pertinent Labs/Studies:   Results for orders placed or performed during the hospital encounter of 02/18/24 (from the past 72 hours)  Type and screen     Status: None (Preliminary result)   Collection Time:  02/18/24  6:34 AM  Result Value Ref Range   ABO/RH(D) PENDING    Antibody Screen PENDING    Sample Expiration      02/21/2024,2359 Performed at [facility], [address], [place], [place] [address]   ABO/Rh     Status: None (Preliminary result)   Collection Time: 02/18/24  6:36 AM  Result Value Ref Range   ABO/RH(D) PENDING        Assessment and Plan :Jaxyn Mestas is a 28 y.o. G2P2001 here for pelvic pain.   Patient desires surgical management with diagnostic laparoscopy and operative hysteroscopy.  The risks of surgery were discussed in detail with the patient including but not limited to: bleeding which may require transfusion or reoperation; infection which may require prolonged hospitalization or re-hospitalization and antibiotic therapy; injury to bowel, bladder, ureters and major vessels or other surrounding organs which may lead to other procedures; formation of adhesions; need for additional procedures including laparotomy or subsequent procedures secondary to intraoperative injury or abnormal pathology; thromboembolic phenomenon; incisional problems and other postoperative or anesthesia complications.  Patient was told that the likelihood that her condition and symptoms will be treated effectively with this surgical management was moderate to hight; the postoperative expectations were also discussed in detail. The patient also understands the alternative treatment options which were discussed in full. All questions were answered.       Montoya Brandel, M.D. Minimally Invasive Gynecologic Surgery and Pelvic Pain Specialist Attending Obstetrician & Gynecologist, [facility] [employer], [facility]

## 2024-02-18 NOTE — Anesthesia Postprocedure Evaluation (Signed)
 Anesthesia Post Note  Patient: Nevia Henkin  Procedure(s) Performed: LAPAROSCOPY, DIAGNOSTIC (Abdomen) LYSIS, ADHESIONS, LAPAROSCOPIC (Abdomen) DILATATION AND CURETTAGE /HYSTEROSCOPY (Vagina )     Patient location during evaluation: PACU Anesthesia Type: General Level of consciousness: awake and alert and oriented Pain management: pain level controlled Vital Signs Assessment: post-procedure vital signs reviewed and stable Respiratory status: spontaneous breathing, nonlabored ventilation and respiratory function stable Cardiovascular status: blood pressure returned to baseline and stable Postop Assessment: no apparent nausea or vomiting Anesthetic complications: no   No notable events documented.  Last Vitals:  Vitals:   02/18/24 1030 02/18/24 1045  BP: 97/73 100/73  Pulse:    Resp:    Temp:  37.1 C  SpO2: 99%     Last Pain:  Vitals:   02/18/24 1030  TempSrc:   PainSc: 2                  Thanos Cousineau A.

## 2024-02-18 NOTE — Transfer of Care (Signed)
 Immediate Anesthesia Transfer of Care Note  Patient: Cindy Martinez  Procedure(s) Performed: LAPAROSCOPY, DIAGNOSTIC (Abdomen) LYSIS, ADHESIONS, LAPAROSCOPIC (Abdomen) DILATATION AND CURETTAGE /HYSTEROSCOPY (Vagina )  Patient Location: PACU  Anesthesia Type:General  Level of Consciousness: drowsy  Airway & Oxygen Therapy: Patient Spontanous Breathing and Patient connected to face mask oxygen  Post-op Assessment: Report given to RN and Post -op Vital signs reviewed and stable  Post vital signs: Reviewed and stable  Last Vitals:  Vitals Value Taken Time  BP    Temp    Pulse 91 02/18/24 10:05  Resp 16 02/18/24 10:05  SpO2 100 % 02/18/24 10:05  Vitals shown include unfiled device data.  Last Pain:  Vitals:   02/18/24 0558  TempSrc: Oral  PainSc: 1       Patients Stated Pain Goal: 4 (02/18/24 0558)  Complications: No notable events documented.

## 2024-02-19 ENCOUNTER — Emergency Department (HOSPITAL_COMMUNITY)

## 2024-02-19 ENCOUNTER — Encounter (HOSPITAL_COMMUNITY): Payer: Self-pay | Admitting: Obstetrics and Gynecology

## 2024-02-19 ENCOUNTER — Emergency Department (HOSPITAL_COMMUNITY)
Admission: EM | Admit: 2024-02-19 | Discharge: 2024-02-19 | Disposition: A | Discharge status: Home / Self Care | Attending: Emergency Medicine | Admitting: Emergency Medicine

## 2024-02-19 ENCOUNTER — Ambulatory Visit: Payer: Self-pay | Admitting: Obstetrics and Gynecology

## 2024-02-19 ENCOUNTER — Telehealth: Payer: Self-pay

## 2024-02-19 ENCOUNTER — Other Ambulatory Visit: Payer: Self-pay

## 2024-02-19 DIAGNOSIS — D72829 Elevated white blood cell count, unspecified: Secondary | ICD-10-CM | POA: Diagnosis not present

## 2024-02-19 DIAGNOSIS — G8918 Other acute postprocedural pain: Secondary | ICD-10-CM | POA: Diagnosis not present

## 2024-02-19 DIAGNOSIS — N281 Cyst of kidney, acquired: Secondary | ICD-10-CM | POA: Diagnosis not present

## 2024-02-19 DIAGNOSIS — N200 Calculus of kidney: Secondary | ICD-10-CM | POA: Diagnosis not present

## 2024-02-19 DIAGNOSIS — R109 Unspecified abdominal pain: Secondary | ICD-10-CM | POA: Diagnosis not present

## 2024-02-19 DIAGNOSIS — R10819 Abdominal tenderness, unspecified site: Secondary | ICD-10-CM | POA: Diagnosis present

## 2024-02-19 LAB — CBC WITH DIFFERENTIAL/PLATELET
Abs Immature Granulocytes: 0.04 10*3/uL (ref 0.00–0.07)
Basophils Absolute: 0 10*3/uL (ref 0.0–0.1)
Basophils Relative: 0 %
Eosinophils Absolute: 0 10*3/uL (ref 0.0–0.5)
Eosinophils Relative: 0 %
HCT: 40.6 % (ref 36.0–46.0)
Hemoglobin: 13.3 g/dL (ref 12.0–15.0)
Immature Granulocytes: 0 %
Lymphocytes Relative: 25 %
Lymphs Abs: 3 10*3/uL (ref 0.7–4.0)
MCH: 29 pg (ref 26.0–34.0)
MCHC: 32.8 g/dL (ref 30.0–36.0)
MCV: 88.5 fL (ref 80.0–100.0)
Monocytes Absolute: 1 10*3/uL (ref 0.1–1.0)
Monocytes Relative: 8 %
Neutro Abs: 7.8 10*3/uL — ABNORMAL HIGH (ref 1.7–7.7)
Neutrophils Relative %: 67 %
Platelets: 418 10*3/uL — ABNORMAL HIGH (ref 150–400)
RBC: 4.59 MIL/uL (ref 3.87–5.11)
RDW: 12.6 % (ref 11.5–15.5)
WBC: 11.9 10*3/uL — ABNORMAL HIGH (ref 4.0–10.5)
nRBC: 0 % (ref 0.0–0.2)

## 2024-02-19 LAB — COMPREHENSIVE METABOLIC PANEL WITH GFR
ALT: 17 U/L (ref 0–44)
AST: 18 U/L (ref 15–41)
Albumin: 4.2 g/dL (ref 3.5–5.0)
Alkaline Phosphatase: 51 U/L (ref 38–126)
Anion gap: 8 (ref 5–15)
BUN: 14 mg/dL (ref 6–20)
CO2: 25 mmol/L (ref 22–32)
Calcium: 9 mg/dL (ref 8.9–10.3)
Chloride: 108 mmol/L (ref 98–111)
Creatinine, Ser: 0.87 mg/dL (ref 0.44–1.00)
GFR, Estimated: >60 mL/min (ref >60)
Glucose, Bld: 93 mg/dL (ref 70–99)
Potassium: 3.7 mmol/L (ref 3.5–5.1)
Sodium: 141 mmol/L (ref 135–145)
Total Bilirubin: 1.4 mg/dL — ABNORMAL HIGH (ref 0.0–1.2)
Total Protein: 7.3 g/dL (ref 6.5–8.1)

## 2024-02-19 LAB — URINALYSIS, W/ REFLEX TO CULTURE (INFECTION SUSPECTED)
Bilirubin Urine: NEGATIVE
Glucose, UA: NEGATIVE mg/dL
Ketones, ur: NEGATIVE mg/dL
Leukocytes,Ua: NEGATIVE
Nitrite: NEGATIVE
Protein, ur: NEGATIVE mg/dL
Specific Gravity, Urine: 1.019 (ref 1.005–1.030)
pH: 5 (ref 5.0–8.0)

## 2024-02-19 LAB — I-STAT CG4 LACTIC ACID, ED
Lactic Acid, Venous: 0.9 mmol/L (ref 0.5–1.9)
Lactic Acid, Venous: 0.6 mmol/L (ref 0.5–1.9)

## 2024-02-19 LAB — SURGICAL PATHOLOGY

## 2024-02-19 LAB — HCG, SERUM, QUALITATIVE: Preg, Serum: NEGATIVE

## 2024-02-19 MED ORDER — FENTANYL CITRATE PF 50 MCG/ML IJ SOSY
50.0000 ug | PREFILLED_SYRINGE | Freq: Once | INTRAMUSCULAR | Status: AC
  Administered 2024-02-19: 50 ug via INTRAVENOUS
  Filled 2024-02-19: qty 1

## 2024-02-19 MED ORDER — HYDROMORPHONE HCL 1 MG/ML IJ SOLN
1.0000 mg | Freq: Once | INTRAMUSCULAR | Status: AC
  Administered 2024-02-19: 1 mg via INTRAVENOUS
  Filled 2024-02-19: qty 1

## 2024-02-19 MED ORDER — GABAPENTIN 300 MG PO CAPS: 300.0000 mg | ORAL_CAPSULE | Freq: Three times a day (TID) | ORAL | 0 refills | Status: AC | PRN

## 2024-02-19 MED ORDER — ONDANSETRON HCL 4 MG/2ML IJ SOLN
4.0000 mg | Freq: Once | INTRAMUSCULAR | Status: AC
Start: 2024-02-19 — End: 2024-02-19
  Administered 2024-02-19: 4 mg via INTRAVENOUS
  Filled 2024-02-19: qty 2

## 2024-02-19 MED ORDER — SODIUM CHLORIDE 0.9 % IV BOLUS
1000.0000 mL | Freq: Once | INTRAVENOUS | Status: AC
  Administered 2024-02-19: 1000 mL via INTRAVENOUS

## 2024-02-19 MED ORDER — CLONIDINE HCL 0.1 MG PO TABS: 0.1000 mg | ORAL_TABLET | Freq: Two times a day (BID) | ORAL | 0 refills | Status: DC | PRN

## 2024-02-19 MED ORDER — GABAPENTIN 300 MG PO CAPS
300.0000 mg | ORAL_CAPSULE | Freq: Once | ORAL | Status: AC
  Administered 2024-02-19: 300 mg via ORAL
  Filled 2024-02-19: qty 1

## 2024-02-19 MED ORDER — CLONIDINE HCL 0.1 MG PO TABS
0.1000 mg | ORAL_TABLET | Freq: Once | ORAL | Status: AC
  Administered 2024-02-19: 0.1 mg via ORAL
  Filled 2024-02-19: qty 1

## 2024-02-19 NOTE — Discharge Instructions (Signed)
 Continue pain management provided by your OB/GYN.  Follow-up with them.  Return if symptoms worsen.  Especially return if you develop a fever greater than 100.4.

## 2024-02-19 NOTE — ED Triage Notes (Signed)
 Pt. Stated, I was just released from hospital yesterday with lesions, and scar tissue removed . I have 3 openings from the surgery and vaginally. The pain on the right side is so bad I can't even sit down. They said to come here. Even when I lay down it hurts so bad.

## 2024-02-19 NOTE — Telephone Encounter (Signed)
 Patient also states it is hard for her to breathe and she is unable to lie down. Cindy Martinez l Bayan Hedstrom, CMA

## 2024-02-19 NOTE — ED Notes (Signed)
 Pt unable to give urine @ this time, asked twice. Provided urine cup

## 2024-02-19 NOTE — Telephone Encounter (Signed)
 Patient called stating she had surgery yesterday and she is having stabbing pain when she breaths. She states her pain is level 9. Advised patient to go the ER to be seen. Understanding was voiced. Bruce Churilla l Salene Mohamud, CMA

## 2024-02-19 NOTE — Progress Notes (Signed)
 Called patient and confirmed ID x2. Patient notes having significant pain and being very uncomfortable. Unable to lie down and has to sit up. Has continued/worsened pain in RLQ as well as pain just under her rib cage. Reports no pain relief with narcotic provided in ED. Patient reviewed surgical pathology confirming endometriosis and curious about pictures. Reviewed with patient that etiology of pain likely multifactorial: baseline inflammatory process now confirmed to be endometriosis, inflammation from tissue disruption via lysis of adhesions and retained gas from insufflation both within the peritoneal cavity as well as the subcutaneous tissue which is a consequence of laparoscopy. Reviewed that CT scan and CXR were reassuring for no immediate/emergent concerns and likely will need some time for gas to dissipate and body to recover from surgery. Noted that intraoperative photos should be available in MyChart and we will review at follow up as well as long term management of endometriosis. Patient reported feeling very tired and wanting to go home after being in ED all day, offered adjuvant analgesia with clonidine (0.1mg  BID prn)  and gabapentin (300mg  TID prn) which patient has not previously tried and will relay information to ED provider. Medications sent to listed pharmacy, acknowledged confirmed diagnosis of endometriosis and clinic will reach out to schedule follow up.   Kiki Pelton, MD, FACOG Minimally Invasive Gynecologic Surgery  Obstetrics and Gynecology, [facility] for [facility], [facility] 02/19/2024

## 2024-02-19 NOTE — ED Provider Notes (Signed)
 Urinalysis negative for infection.  Patient here with postop pain after having lysis of adhesion surgery done laparoscopically yesterday.  CT scan showed small amount of free peritoneal air and air in the right anterior abdominal wall and chest wall.  However within normal limits a laparoscopic surgery yesterday.  She has been making urine.  She is having bowel movements and passing gas.  Pain is mostly when she moves or takes a deep breath.  I do suspect that this pain is secondary to the free air that is remaining from her laparoscopic procedure yesterday.  I have very low suspicion that there is a perforated viscus.  I have talked with her OB doctor Dr. Katherne Pander who performed the surgery to update her about her care today as Dr. Liam Redhead had already talked to her as well.  Patient still had a lot of concerns about her discomfort.  OB doctor had recommended clonidine and gabapentin to go along with her other pain medicine at home.  Sounds like she has narcotic prescription at home.  I offered her to 1 as well but sounds like she has 1.  Ultimately she was given gabapentin and clonidine here in the ED.  I did offer her possible admission for further pain management and observation but she elected to go home.  After she had talked to her OB doctor she felt better about how things were going and overall her hope is that this is postop pain from some the free air still being there from her laparoscopic surgery.  She understands to return if pain persists especially if she develops a fever greater than 100.4 or other concerning symptoms.  Patient was discharged.  This chart was dictated using voice recognition software.  Despite best efforts to proofread,  errors can occur which can change the documentation meaning.    Cindy Rue, DO 02/19/24 1823

## 2024-02-19 NOTE — ED Provider Triage Note (Signed)
 Emergency Medicine Provider Triage Evaluation Note  Cindy Martinez , a 28 y.o. female  was evaluated in triage.  Pt complains of right-sided abdominal pain..  Patient is postop day 1 following lysis of adhesions.  She notes that on discharge upon arriving home she began having right-sided pain which has been persistent with nausea, inability to achieve position of comfort.  Review of Systems  Positive: Abdominal pain Negative: Fever  Physical Exam  BP 136/80 (BP Location: Left Arm)   Pulse (!) 103   Temp 98.2 F (36.8 C)   Resp 16   LMP 02/11/2024 (Exact Date)   SpO2 100%  Gen:   Awake, no distress speaking clearly Resp:  Normal effort  MSK:   Moves extremities without difficulty no deformity Other:  Abdomen tender right side, less so left side, surgical incisions clean, dry, intact, iodine in place  Medical Decision Making  Medically screening exam initiated at 11:44 AM.  Appropriate orders placed.  Yaslene Lindamood was informed that the remainder of the evaluation will be completed by another provider, this initial triage assessment does not replace that evaluation, and the importance of remaining in the ED until their evaluation is complete.   Dorenda Gandy, MD 02/19/24 1146

## 2024-02-19 NOTE — ED Provider Notes (Signed)
 [place] [facility] Provider Note   CSN: [id number] Arrival date & time: 02/19/24  1008     Patient presents with: Post-op Problem and right side abdominal pain   Cindy Martinez is a 28 y.o. female.   HPI Patient presents with right-sided abdominal pain.  She is 1 day status post lysis of adhesions performed here.  She presents with her husband.  She notes that after arriving home she has been unable to achieve a position of comfort due to pain in the right side.  No vomiting, though she has nausea    Prior to Admission medications   Medication Sig Start Date End Date Taking? Authorizing Provider  acetaminophen  (TYLENOL ) 500 MG tablet Take 1 tablet (500 mg total) by mouth every 6 (six) hours as needed. 02/18/24   Ajewole, Christana, MD  amphetamine-dextroamphetamine (ADDERALL XR) 20 MG 24 hr capsule Take 20 mg by mouth daily.    [provider]  ibuprofen  (ADVIL ) 600 MG tablet Take 1 tablet (600 mg total) by mouth every 6 (six) hours as needed. 02/18/24   Ajewole, Christana, MD  oxyCODONE (OXY IR/ROXICODONE) 5 MG immediate release tablet Take 1 tablet (5 mg total) by mouth every 4 (four) hours as needed. 02/18/24   Ajewole, Christana, MD  prochlorperazine  (COMPAZINE ) 10 MG tablet Take 1 tablet (10 mg total) by mouth every 6 (six) hours as needed for nausea or vomiting. Patient not taking: Reported on 10/23/2019 10/16/19 03/19/20  Alissa April, MD    Allergies: Ciprofloxacin, Meloxicam, and Omnipaque  [iohexol ]    Review of Systems  Updated Vital Signs BP 136/80 (BP Location: Left Arm)   Pulse (!) 103   Temp 98.2 F (36.8 C)   Resp 16   LMP 02/11/2024 (Exact Date)   SpO2 100%   Physical Exam Vitals and nursing note reviewed.  Constitutional:      Appearance: She is well-developed. She is ill-appearing.  HENT:     Head: Normocephalic and atraumatic.   Eyes:     Conjunctiva/sclera: Conjunctivae normal.    Cardiovascular:     Rate  and Rhythm: Normal rate and regular rhythm.  Pulmonary:     Effort: Pulmonary effort is normal. No respiratory distress.     Breath sounds: Normal breath sounds. No stridor.  Abdominal:     General: There is no distension.     Tenderness: There is abdominal tenderness. There is right CVA tenderness and guarding.   Skin:    General: Skin is warm and dry.   Neurological:     Mental Status: She is alert and oriented to person, place, and time.     Cranial Nerves: No cranial nerve deficit.   Psychiatric:        Mood and Affect: Mood normal.     (all labs ordered are listed, but only abnormal results are displayed) Labs Reviewed  COMPREHENSIVE METABOLIC PANEL WITH GFR  CBC WITH DIFFERENTIAL/PLATELET  URINALYSIS, W/ REFLEX TO CULTURE (INFECTION SUSPECTED)  HCG, SERUM, QUALITATIVE  I-STAT CG4 LACTIC ACID, ED    EKG: None  Radiology: No results found.   Procedures   Medications Ordered in the ED - No data to display                                  Medical Decision Making Patient with pain in the postoperative setting, nausea.  Reassuringly, patient is afebrile, vital signs reassuring, little  initial evidence for infection, though this is a consideration. I discussed her case with her surgeon, patient will have CT, labs, analgesics.   Amount and/or Complexity of Data Reviewed Independent Historian: spouse External Data Reviewed: notes.    Details: Operative note reviewed Labs: ordered. Decision-making details documented in ED Course. Radiology: ordered and independent interpretation performed. Decision-making details documented in ED Course.    Details: CT reassuring  Risk Prescription drug management. Decision regarding hospitalization. Diagnosis or treatment significantly limited by social determinants of health.  Patient's physician and I discussed her case [id number] - Dr. Jordis Nevins   4:52 PM Patient awaiting urinalysis has received additional analgesics.   On signout urinalysis pending, Dr. Colonel Dears aware.  Final diagnoses:  Post-operative pain     Dorenda Gandy, MD 02/19/24 [id number]

## 2024-02-26 ENCOUNTER — Other Ambulatory Visit: Payer: Self-pay | Admitting: Obstetrics and Gynecology

## 2024-03-04 ENCOUNTER — Ambulatory Visit: Admitting: Obstetrics and Gynecology

## 2024-03-04 ENCOUNTER — Other Ambulatory Visit (HOSPITAL_COMMUNITY)
Admission: RE | Admit: 2024-03-04 | Discharge: 2024-03-04 | Disposition: A | Discharge status: Home / Self Care | Source: Ambulatory Visit | Attending: Obstetrics and Gynecology | Admitting: Obstetrics and Gynecology

## 2024-03-04 VITALS — BP 114/76 | HR 101 | Wt 165.0 lb

## 2024-03-04 DIAGNOSIS — N760 Acute vaginitis: Secondary | ICD-10-CM

## 2024-03-04 DIAGNOSIS — G8929 Other chronic pain: Secondary | ICD-10-CM

## 2024-03-04 DIAGNOSIS — N946 Dysmenorrhea, unspecified: Secondary | ICD-10-CM

## 2024-03-04 DIAGNOSIS — R102 Pelvic and perineal pain: Secondary | ICD-10-CM

## 2024-03-04 DIAGNOSIS — N809 Endometriosis, unspecified: Secondary | ICD-10-CM

## 2024-03-04 NOTE — Progress Notes (Signed)
   POSTOPERATIVE VISIT NOTE   Subjective:     Cindy Martinez is a 28 y.o. G2P2001 who presents to the clinic 2 weeks status post lysis of adhesions and laparoscopy for pelvic pain. Eating a regular diet without difficulty. Bowel movements are at baseline.  Incision: healing well  Start extended cycle OCPs . Haivng cramping throughout the cycle. Had previously been offered SI joint injection  Stopped taking the meds offered in ED (gabapentin  and clonidine )  - wasn't helping and threw away the meds.   Sewlling in RUQ and feels tht it is till there. Pressure is still present after the surgery  Notes having abdominal pain with stretching.   The following portions of the patient's history were reviewed and updated as appropriate: allergies, current medications, past family history, past medical history, past social history, past surgical history, and problem list..   Review of Systems Pertinent items are noted in HPI.    Objective:    BP 114/76 (BP Location: Left Arm, Patient Position: Sitting, Cuff Size: Normal)   Pulse (!) 101   Wt 165 lb (74.8 kg)   LMP 02/11/2024 (Exact Date)   BMI 29.23 kg/m  General:  alert, cooperative, and no distress  Abdomen: soft, appropriately tender  Incision:   healing well, no drainage, no erythema, no hernia, no seroma, no swelling, no dehiscence, incision well approximated  Pelvic:   Exam deferred.    Pathology Results: FINAL MICROSCOPIC DIAGNOSIS:   A. ENDOMETRIUM, CURETTAGE AND POLYP:  - Polypoid fragments of proliferative endometrium.  - Negative for hyperplasia, atypia/EIN, and malignancy.   B. CUL-DE-SAC, POSTERIOR, BIOPSY:  - Endometriosis.   C. UTEROSACRAL, RIGHT, BIOPSY:  - Endometriosis.   D. PHLEBOLITH, BIOPSY:  - Benign adipose tissue with focal area of organizing fat necrosis.   E. PELVIC SIDEWALL, RIGHT, BIOPSY:  - Endometriosis.   F. UTERUS, POSTERIOR, BIOPSY:  - Small fragment of unremarkable mesothelial-lined fibrous  tissue.   G. UTEROSACRAL, LEFT, BIOPSY:  - Fibrous tissue with marked thermal artifact and focal area suggestive  of endometriosis.     Assessment:   Doing well postoperatively. Operative findings again reviewed. Pathology report discussed.   Plan:    1. Acute vaginitis (Primary) Vaginitis swab collected - Cervicovaginal ancillary only  2. Dysmenorrhea Proceed with extended cycle OCPs to allow for quarterly withdrawal bleed with suppression with endometriosis - levonorgestrel-ethinyl estradiol (SEASONALE) 0.15-0.03 MG tablet; Take 1 tablet by mouth daily.  Dispense: 91 tablet; Refill: 4  3. Chronic pelvic pain in female - levonorgestrel-ethinyl estradiol (SEASONALE) 0.15-0.03 MG tablet; Take 1 tablet by mouth daily.  Dispense: 91 tablet; Refill: 4  4. Endometriosis Reviewed pathology confirming diagnosis of endometriosis. Reviewed chronicity and inflammatory nature of disease and potential future treatments. Answered all questions. After review of options, decision made to proceed with suppression with quarterly withdrawal bleed.  - levonorgestrel-ethinyl estradiol (SEASONALE) 0.15-0.03 MG tablet; Take 1 tablet by mouth daily.  Dispense: 91 tablet; Refill: 4   Activity restrictions: none Follow up: 3 months  Carter Quarry, MD Obstetrician & Gynecologist, [facility] for [employer], [facility]

## 2024-03-09 LAB — CERVICOVAGINAL ANCILLARY ONLY
Bacterial Vaginitis (gardnerella): NEGATIVE
Candida Glabrata: NEGATIVE
Candida Vaginitis: NEGATIVE
Chlamydia: NEGATIVE
Comment: NEGATIVE
Comment: NEGATIVE
Comment: NEGATIVE
Comment: NEGATIVE
Comment: NEGATIVE
Comment: NORMAL
Neisseria Gonorrhea: NEGATIVE
Trichomonas: NEGATIVE

## 2024-03-10 ENCOUNTER — Ambulatory Visit: Payer: Self-pay | Admitting: Obstetrics and Gynecology

## 2024-03-10 MED ORDER — LEVONORGEST-ETH ESTRAD 91-DAY 0.15-0.03 MG PO TABS: 1.0000 | ORAL_TABLET | Freq: Every day | ORAL | 4 refills | Status: AC

## 2024-03-14 DIAGNOSIS — Z419 Encounter for procedure for purposes other than remedying health state, unspecified: Secondary | ICD-10-CM | POA: Diagnosis not present

## 2024-03-21 ENCOUNTER — Encounter (HOSPITAL_BASED_OUTPATIENT_CLINIC_OR_DEPARTMENT_OTHER): Payer: Self-pay | Admitting: Emergency Medicine

## 2024-03-21 ENCOUNTER — Other Ambulatory Visit: Payer: Self-pay

## 2024-03-21 ENCOUNTER — Emergency Department (HOSPITAL_BASED_OUTPATIENT_CLINIC_OR_DEPARTMENT_OTHER)
Admission: EM | Admit: 2024-03-21 | Discharge: 2024-03-22 | Disposition: A | Discharge status: Home / Self Care | Attending: Emergency Medicine | Admitting: Emergency Medicine

## 2024-03-21 DIAGNOSIS — N939 Abnormal uterine and vaginal bleeding, unspecified: Secondary | ICD-10-CM | POA: Diagnosis not present

## 2024-03-21 DIAGNOSIS — R109 Unspecified abdominal pain: Secondary | ICD-10-CM | POA: Diagnosis not present

## 2024-03-21 LAB — CBC WITH DIFFERENTIAL/PLATELET
Abs Immature Granulocytes: 0.02 K/uL (ref 0.00–0.07)
Basophils Absolute: 0.1 K/uL (ref 0.0–0.1)
Basophils Relative: 1 %
Eosinophils Absolute: 0 K/uL (ref 0.0–0.5)
Eosinophils Relative: 0 %
HCT: 39.6 % (ref 36.0–46.0)
Hemoglobin: 13.7 g/dL (ref 12.0–15.0)
Immature Granulocytes: 0 %
Lymphocytes Relative: 27 %
Lymphs Abs: 2.1 K/uL (ref 0.7–4.0)
MCH: 29 pg (ref 26.0–34.0)
MCHC: 34.6 g/dL (ref 30.0–36.0)
MCV: 83.7 fL (ref 80.0–100.0)
Monocytes Absolute: 0.3 K/uL (ref 0.1–1.0)
Monocytes Relative: 4 %
Neutro Abs: 5.3 K/uL (ref 1.7–7.7)
Neutrophils Relative %: 68 %
Platelets: 360 K/uL (ref 150–400)
RBC: 4.73 MIL/uL (ref 3.87–5.11)
RDW: 12.2 % (ref 11.5–15.5)
WBC: 7.8 K/uL (ref 4.0–10.5)
nRBC: 0 % (ref 0.0–0.2)

## 2024-03-21 LAB — BASIC METABOLIC PANEL WITH GFR
Anion gap: 12 (ref 5–15)
BUN: 7 mg/dL (ref 6–20)
CO2: 25 mmol/L (ref 22–32)
Calcium: 9.4 mg/dL (ref 8.9–10.3)
Chloride: 103 mmol/L (ref 98–111)
Creatinine, Ser: 0.72 mg/dL (ref 0.44–1.00)
GFR, Estimated: >60 mL/min (ref >60)
Glucose, Bld: 92 mg/dL (ref 70–99)
Potassium: 4.2 mmol/L (ref 3.5–5.1)
Sodium: 139 mmol/L (ref 135–145)

## 2024-03-21 MED ORDER — SODIUM CHLORIDE 0.9 % IV BOLUS
1000.0000 mL | Freq: Once | INTRAVENOUS | Status: AC
  Administered 2024-03-21: 1000 mL via INTRAVENOUS

## 2024-03-21 NOTE — ED Triage Notes (Signed)
 Pt reports vaginal bleeding since Thurs, normal period was th week before that; reports pain in my whole stomach since laparoscopic surg for endometriosis on 6//17; sts pain is worse than before surg

## 2024-03-21 NOTE — ED Provider Notes (Signed)
 [place] [facility] Provider Note   CSN: [id number] Arrival date & time: 03/21/24  2007     Patient presents with: Vaginal Bleeding   Cindy Martinez is a 28 y.o. female.   HPI   28 year old female who is status post laparoscopic surgery about 1 month ago for endometriosis/lysis of adhesions and hysteroscopy presents emergency department with ongoing abdominal pain, vaginal bleeding.  Patient states that she had mild spotting after the procedure which was expected.  Following this she had her 6-day period which was normal.  She then had a couple days with no bleeding and is now experiencing ongoing bright red bleeding.  Denies any clots.  She has ongoing acute/chronic abdominal pain that has progressed since the surgery.  Denies any fever, diarrhea, other acute complaints.  Prior to Admission medications   Medication Sig Start Date End Date Taking? Authorizing Provider  acetaminophen  (TYLENOL ) 500 MG tablet Take 1 tablet (500 mg total) by mouth every 6 (six) hours as needed. Patient not taking: Reported on 03/04/2024 02/18/24   Ajewole, Christana, MD  amphetamine-dextroamphetamine (ADDERALL) 20 MG tablet Take 20 mg by mouth 2 (two) times daily. 01/23/24   [provider]  cloNIDine  (CATAPRES ) 0.1 MG tablet TAKE 1 TABLET (0.1 MG TOTAL) BY MOUTH 2 (TWO) TIMES DAILY AS NEEDED. Patient not taking: Reported on 03/04/2024 02/26/24   Ajewole, Christana, MD  gabapentin  (NEURONTIN ) 300 MG capsule Take 1 capsule (300 mg total) by mouth 3 (three) times daily as needed. Patient not taking: Reported on 03/04/2024 02/19/24   Ajewole, Christana, MD  ibuprofen  (ADVIL ) 600 MG tablet Take 1 tablet (600 mg total) by mouth every 6 (six) hours as needed. Patient not taking: Reported on 03/04/2024 02/18/24   Ajewole, Christana, MD  levonorgestrel-ethinyl estradiol (SEASONALE) 0.15-0.03 MG tablet Take 1 tablet by mouth daily. 03/10/24   Ajewole, Christana, MD  oxyCODONE  (OXY  IR/ROXICODONE ) 5 MG immediate release tablet Take 1 tablet (5 mg total) by mouth every 4 (four) hours as needed. Patient not taking: Reported on 03/04/2024 02/18/24   Ajewole, Christana, MD  prochlorperazine  (COMPAZINE ) 10 MG tablet Take 1 tablet (10 mg total) by mouth every 6 (six) hours as needed for nausea or vomiting. Patient not taking: Reported on 10/23/2019 10/16/19 03/19/20  Raford Lenis, MD    Allergies: Ciprofloxacin, Meloxicam, and Omnipaque  [iohexol ]    Review of Systems  Constitutional:  Positive for fatigue. Negative for fever.  Respiratory:  Negative for shortness of breath.   Cardiovascular:  Negative for chest pain.  Gastrointestinal:  Positive for abdominal pain. Negative for diarrhea and vomiting.  Genitourinary:  Positive for vaginal bleeding. Negative for vaginal discharge.  Skin:  Negative for rash.  Neurological:  Negative for headaches.    Updated Vital Signs BP (!) 129/91   Pulse (!) 113   Temp 98.3 F (36.8 C)   Resp 16   Ht 5' 3 (1.6 m)   Wt 74.8 kg   SpO2 100%   BMI 29.23 kg/m   Physical Exam Vitals and nursing note reviewed.  Constitutional:      General: She is not in acute distress.    Appearance: Normal appearance.  HENT:     Head: Normocephalic.     Mouth/Throat:     Mouth: Mucous membranes are moist.  Cardiovascular:     Rate and Rhythm: Normal rate.  Pulmonary:     Effort: Pulmonary effort is normal. No respiratory distress.  Abdominal:     Palpations: Abdomen  is soft.     Tenderness: There is no abdominal tenderness.  Genitourinary:    Comments: Normal external exam, small amount of bright red blood in the vaginal vault, partial view of the cervix but no acute abnormality noted.  No abnormal discharge/odor Skin:    General: Skin is warm.  Neurological:     Mental Status: She is alert and oriented to person, place, and time. Mental status is at baseline.  Psychiatric:        Mood and Affect: Mood normal.     (all labs ordered are  listed, but only abnormal results are displayed) Labs Reviewed  CBC WITH DIFFERENTIAL/PLATELET  BASIC METABOLIC PANEL WITH GFR    EKG: None  Radiology: No results found.   Procedures   Medications Ordered in the ED - No data to display                                  Medical Decision Making Amount and/or Complexity of Data Reviewed Labs: ordered. Radiology: ordered.   28 year old female presents emergency department with ongoing abdominal pain and now vaginal bleeding.  She is status post laparoscopic surgery 1 month ago where she had lysis of adhesions, hysteroscopy.  Had her normal menstrual period about a week ago that ended and then bleeding restarted.  Tachycardic in triage, normal heart rate on my exam.  Stable blood pressure, overall well-appearing, benign abdominal exam.  Pelvic shows small amount of bright red blood bleeding but no other acute abnormality.  Blood work is normal.  Patient's main concern is that she is having a postoperative complication.  Given the duration of time and normal labs I find this less likely however we will pursue CT imaging for further evaluation.  Patient has documented contrast allergy to IV dye, does not want to wait for pretreatment medications.  For this reason we will do CT of the abdomen pelvis without.  Patient signed out pending this imaging, reevaluation and disposition.     Final diagnoses:  None    ED Discharge Orders     None          Bari Roxie HERO, DO 03/21/24 2328

## 2024-03-22 ENCOUNTER — Other Ambulatory Visit (HOSPITAL_BASED_OUTPATIENT_CLINIC_OR_DEPARTMENT_OTHER)

## 2024-03-22 ENCOUNTER — Emergency Department (HOSPITAL_BASED_OUTPATIENT_CLINIC_OR_DEPARTMENT_OTHER)

## 2024-03-22 DIAGNOSIS — R109 Unspecified abdominal pain: Secondary | ICD-10-CM | POA: Diagnosis not present

## 2024-03-22 DIAGNOSIS — N200 Calculus of kidney: Secondary | ICD-10-CM | POA: Diagnosis not present

## 2024-03-22 NOTE — ED Provider Notes (Signed)
 I assumed care at signout to follow-up on imaging.  Other than incidental findings of kidney stones, no other acute findings.  Patient stable, no acute distress, advised to call her gynecologist for further workup   Midge Golas, MD 03/22/24 0028

## 2024-04-02 DIAGNOSIS — K760 Fatty (change of) liver, not elsewhere classified: Secondary | ICD-10-CM | POA: Diagnosis not present

## 2024-04-14 DIAGNOSIS — Z419 Encounter for procedure for purposes other than remedying health state, unspecified: Secondary | ICD-10-CM | POA: Diagnosis not present

## 2024-05-15 DIAGNOSIS — Z419 Encounter for procedure for purposes other than remedying health state, unspecified: Secondary | ICD-10-CM | POA: Diagnosis not present

## 2024-05-27 DIAGNOSIS — R399 Unspecified symptoms and signs involving the genitourinary system: Secondary | ICD-10-CM | POA: Diagnosis not present

## 2024-05-27 DIAGNOSIS — Z2821 Immunization not carried out because of patient refusal: Secondary | ICD-10-CM | POA: Diagnosis not present

## 2024-05-27 DIAGNOSIS — F909 Attention-deficit hyperactivity disorder, unspecified type: Secondary | ICD-10-CM | POA: Diagnosis not present

## 2024-05-27 DIAGNOSIS — Z6829 Body mass index (BMI) 29.0-29.9, adult: Secondary | ICD-10-CM | POA: Diagnosis not present

## 2024-05-27 DIAGNOSIS — Z79899 Other long term (current) drug therapy: Secondary | ICD-10-CM | POA: Diagnosis not present

## 2024-05-27 DIAGNOSIS — R030 Elevated blood-pressure reading, without diagnosis of hypertension: Secondary | ICD-10-CM | POA: Diagnosis not present

## 2024-06-16 ENCOUNTER — Ambulatory Visit: Admitting: Obstetrics and Gynecology

## 2024-07-28 DIAGNOSIS — Z8619 Personal history of other infectious and parasitic diseases: Secondary | ICD-10-CM | POA: Diagnosis not present

## 2024-07-28 DIAGNOSIS — L0390 Cellulitis, unspecified: Secondary | ICD-10-CM | POA: Diagnosis not present

## 2024-07-28 DIAGNOSIS — B9689 Other specified bacterial agents as the cause of diseases classified elsewhere: Secondary | ICD-10-CM | POA: Diagnosis not present

## 2024-07-28 DIAGNOSIS — R000 Tachycardia, unspecified: Secondary | ICD-10-CM | POA: Diagnosis not present

## 2024-08-19 ENCOUNTER — Encounter: Payer: Self-pay | Admitting: Obstetrics and Gynecology

## 2024-08-19 DIAGNOSIS — N939 Abnormal uterine and vaginal bleeding, unspecified: Secondary | ICD-10-CM

## 2024-08-19 DIAGNOSIS — F909 Attention-deficit hyperactivity disorder, unspecified type: Secondary | ICD-10-CM | POA: Diagnosis not present

## 2024-08-19 DIAGNOSIS — Z2821 Immunization not carried out because of patient refusal: Secondary | ICD-10-CM | POA: Diagnosis not present

## 2024-08-19 DIAGNOSIS — Z6829 Body mass index (BMI) 29.0-29.9, adult: Secondary | ICD-10-CM | POA: Diagnosis not present

## 2024-08-19 DIAGNOSIS — Z79899 Other long term (current) drug therapy: Secondary | ICD-10-CM | POA: Diagnosis not present

## 2024-08-28 NOTE — Addendum Note (Signed)
 Addended by: Jazmeen Axtell O on: 08/28/2024 09:01 AM   Modules accepted: Orders

## 2024-09-01 ENCOUNTER — Ambulatory Visit (HOSPITAL_BASED_OUTPATIENT_CLINIC_OR_DEPARTMENT_OTHER)
Admission: RE | Admit: 2024-09-01 | Discharge: 2024-09-01 | Disposition: A | Discharge status: Home / Self Care | Source: Ambulatory Visit | Attending: Obstetrics and Gynecology | Admitting: Obstetrics and Gynecology

## 2024-09-01 DIAGNOSIS — N939 Abnormal uterine and vaginal bleeding, unspecified: Secondary | ICD-10-CM | POA: Insufficient documentation

## 2024-09-15 ENCOUNTER — Ambulatory Visit: Payer: Self-pay | Admitting: Obstetrics and Gynecology

## 2024-10-12 ENCOUNTER — Ambulatory Visit: Payer: Self-pay | Admitting: Obstetrics and Gynecology

## 2024-11-16 ENCOUNTER — Ambulatory Visit: Admitting: Family Medicine
